# Patient Record
Sex: Female | Born: 1970 | Race: Black or African American | Hispanic: No | Marital: Married | Smoking: Never smoker
Health system: Southern US, Community
[De-identification: ages and names within clinical notes are randomized; demographics above are authoritative.]

## PROBLEM LIST (undated history)

## (undated) DIAGNOSIS — A499 Bacterial infection, unspecified: Secondary | ICD-10-CM

## (undated) DIAGNOSIS — IMO0002 Reserved for concepts with insufficient information to code with codable children: Secondary | ICD-10-CM

## (undated) DIAGNOSIS — B379 Candidiasis, unspecified: Secondary | ICD-10-CM

## (undated) DIAGNOSIS — E611 Iron deficiency: Secondary | ICD-10-CM

## (undated) DIAGNOSIS — R1030 Lower abdominal pain, unspecified: Secondary | ICD-10-CM

## (undated) DIAGNOSIS — Z8619 Personal history of other infectious and parasitic diseases: Secondary | ICD-10-CM

## (undated) DIAGNOSIS — B009 Herpesviral infection, unspecified: Secondary | ICD-10-CM

## (undated) HISTORY — PX: WISDOM TOOTH EXTRACTION: SHX21

## (undated) HISTORY — DX: Iron deficiency: E61.1

## (undated) HISTORY — DX: Bacterial infection, unspecified: A49.9

## (undated) HISTORY — DX: Lower abdominal pain, unspecified: R10.30

## (undated) HISTORY — PX: ABDOMINAL HYSTERECTOMY: SHX81

## (undated) HISTORY — DX: Personal history of other infectious and parasitic diseases: Z86.19

## (undated) HISTORY — DX: Candidiasis, unspecified: B37.9

## (undated) HISTORY — DX: Herpesviral infection, unspecified: B00.9

## (undated) HISTORY — DX: Reserved for concepts with insufficient information to code with codable children: IMO0002

---

## 2011-08-30 ENCOUNTER — Encounter: Payer: Self-pay | Admitting: Internal Medicine

## 2011-09-19 ENCOUNTER — Ambulatory Visit: Payer: Self-pay | Admitting: Internal Medicine

## 2011-10-12 ENCOUNTER — Encounter: Payer: Self-pay | Admitting: Internal Medicine

## 2011-10-17 ENCOUNTER — Ambulatory Visit: Payer: Self-pay | Admitting: Internal Medicine

## 2011-11-20 DIAGNOSIS — IMO0002 Reserved for concepts with insufficient information to code with codable children: Secondary | ICD-10-CM

## 2011-11-20 DIAGNOSIS — R87619 Unspecified abnormal cytological findings in specimens from cervix uteri: Secondary | ICD-10-CM

## 2011-11-20 HISTORY — DX: Unspecified abnormal cytological findings in specimens from cervix uteri: R87.619

## 2011-11-20 HISTORY — DX: Reserved for concepts with insufficient information to code with codable children: IMO0002

## 2011-12-25 DIAGNOSIS — R1030 Lower abdominal pain, unspecified: Secondary | ICD-10-CM

## 2011-12-25 HISTORY — DX: Lower abdominal pain, unspecified: R10.30

## 2011-12-26 ENCOUNTER — Encounter: Payer: Self-pay | Admitting: Obstetrics and Gynecology

## 2011-12-26 ENCOUNTER — Ambulatory Visit (INDEPENDENT_AMBULATORY_CARE_PROVIDER_SITE_OTHER): Payer: Commercial Indemnity | Admitting: Obstetrics and Gynecology

## 2011-12-26 VITALS — BP 110/74 | Wt 132.0 lb

## 2011-12-26 DIAGNOSIS — Z0189 Encounter for other specified special examinations: Secondary | ICD-10-CM

## 2011-12-26 DIAGNOSIS — A6000 Herpesviral infection of urogenital system, unspecified: Secondary | ICD-10-CM

## 2011-12-26 DIAGNOSIS — A6009 Herpesviral infection of other urogenital tract: Secondary | ICD-10-CM

## 2011-12-26 DIAGNOSIS — R87610 Atypical squamous cells of undetermined significance on cytologic smear of cervix (ASC-US): Secondary | ICD-10-CM

## 2011-12-26 DIAGNOSIS — B977 Papillomavirus as the cause of diseases classified elsewhere: Secondary | ICD-10-CM

## 2011-12-26 NOTE — Progress Notes (Signed)
Previous Pap Smear: 12/04/2011 acus hpv positive  Previous Colposcopy: none  Referred From: DR. Adonis Brook  LMP:12/04/2011 Contraception: b/c pills  G,P: 3,1  HISTORY OF PRESENT ILLNESS  [name] is a 41 y.o. year old female,G3P1021, who presents for a problem visit. The patient has a history of an ASCUS Pap smear with high risk HPV.  She has a history of herpes virus.  She currently uses her control pills.  This is her first visit to our office.  Subjective:  Occasional vague abdominal pain.  Objective:  BP 110/74  Wt 132 lb (59.875 kg)  LMP 12/04/2011   General: alert and no distress GI: soft and nontender  External genitalia: normal general appearance Vaginal: normal without tenderness, induration or masses and relaxation noted Cervix: see colposcopy note Adnexa: normal bimanual exam Uterus: normal size shape and consistency  COLPOSCOPY NOTE:  The colposcopy procedure was explained.  The patient's questions were answered. A permit is signed .Urine pregnancy test is negative. A speculum exam was performed.  The cervix was prepped with acetic acid and Hurricaine gel.  The cervix was evaluated using a white light and the green filter. Findings: white epithelium was noted at the 1:00 and 9:00 positions. .  The endocervical canal was clear.  No lesions were seen.  Biopsies obtained: 1:00 and 9:00.  Hemostasis was adequate.  An endocervical curettage was performed.  Again, hemostasis was adequate. The procedure was terminated.  The patient tolerated her procedure well.  The specimens were sent to pathology.  Assessment:  ASCUS Pap with HPV Herpes infection Date abdominal pain  Plan:  Biopsy at 1:00, 9:00, and endocervical curettage sent to pathology.  Review of abdominal pain next visit.  Abnormal Pap smears, HPV, and Gardasil were discussed  Return to office in 2 week(s).  Leonard Schwartz M.D.  12/26/2011 6:54 PM

## 2011-12-27 ENCOUNTER — Encounter: Payer: Self-pay | Admitting: Obstetrics and Gynecology

## 2011-12-28 LAB — PATHOLOGY

## 2012-01-11 ENCOUNTER — Encounter: Payer: Self-pay | Admitting: Obstetrics and Gynecology

## 2012-01-11 ENCOUNTER — Ambulatory Visit (INDEPENDENT_AMBULATORY_CARE_PROVIDER_SITE_OTHER): Payer: Commercial Indemnity | Admitting: Obstetrics and Gynecology

## 2012-01-11 VITALS — BP 118/84 | Wt 132.0 lb

## 2012-01-11 DIAGNOSIS — N912 Amenorrhea, unspecified: Secondary | ICD-10-CM

## 2012-01-11 DIAGNOSIS — N870 Mild cervical dysplasia: Secondary | ICD-10-CM

## 2012-01-11 DIAGNOSIS — IMO0002 Reserved for concepts with insufficient information to code with codable children: Secondary | ICD-10-CM

## 2012-01-11 LAB — POCT URINE PREGNANCY: Preg Test, Ur: NEGATIVE

## 2012-01-11 NOTE — Progress Notes (Signed)
HISTORY OF PRESENT ILLNESS  [name] is a 41 y.o. year old female,G3P1021, who presents for a problem visit. The patient had colposcopy and biopsies which showed CIN-1.  See path report below.  Subjective:  The patient reports not having a period.  She takes are birth-control pills every day.  Objective:  BP 118/84  Wt 132 lb (59.875 kg)  LMP 12/04/2011   General: no distress GI: soft and nontender  Exam deferred.  Pregnancy test: Negative  Assessment:  CIN-1  Amenorrhea on birth control pills  Plan:  CIN-1 discussed. Management options reviewed.  Risk and benefits outlined.  I recommend repeating Pap smear in 6 months.  Menorrhea on birth control pills reviewed.  We will observe only for now.  Continue daily birth control pills.  Return to office prn if symptoms worsen or fail to improve.   Leonard Schwartz M.D.  01/11/2012 6:07 PM    FINAL DIAGNOSIS: A. Cervix- Biopsy, 1 o'clock:  Low grade squamous intraepithelial lesion (LSIL), mild dysplasia, CIN I.  See comment.  B. Cervix- Biopsy, 9 o'clock:  Low grade squamous intraepithelial lesion (LSIL), mild dysplasia, CIN I.  See comment.  C. Endocervix - Curettage:  Nondiagnostic.  No significant tissue survived processing.

## 2012-07-01 ENCOUNTER — Ambulatory Visit: Payer: Commercial Indemnity | Admitting: Obstetrics and Gynecology

## 2012-07-01 ENCOUNTER — Encounter: Payer: Self-pay | Admitting: Obstetrics and Gynecology

## 2012-07-01 ENCOUNTER — Other Ambulatory Visit: Payer: Self-pay

## 2012-07-01 VITALS — BP 90/60 | HR 68 | Resp 16 | Ht 64.0 in | Wt 125.0 lb

## 2012-07-01 DIAGNOSIS — Z01419 Encounter for gynecological examination (general) (routine) without abnormal findings: Secondary | ICD-10-CM

## 2012-07-01 DIAGNOSIS — R102 Pelvic and perineal pain: Secondary | ICD-10-CM

## 2012-07-01 DIAGNOSIS — Z124 Encounter for screening for malignant neoplasm of cervix: Secondary | ICD-10-CM

## 2012-07-01 MED ORDER — VALACYCLOVIR HCL 500 MG PO TABS: 500.0000 mg | ORAL_TABLET | Freq: Every day | ORAL | Status: AC

## 2012-07-01 NOTE — Progress Notes (Signed)
ANNUAL GYNECOLOGIC EXAMINATION   Loretta Sweeney is a 42 y.o. female, [id number], who presents for an annual exam. The patient has a history of CIN-1.  She has a history of herpes but has infrequent outbreaks.  She complains of occasional pain deep in her pelvis but also increasing dyspareunia. The patient's mother had breast cancer in her 28s.  Prior Hysterectomy: No    History   Social History  . Marital Status: Married    Spouse Name: N/A    Number of Children: N/A  . Years of Education: N/A   Social History Main Topics  . Smoking status: Never Smoker   . Smokeless tobacco: Never Used  . Alcohol Use: No  . Drug Use: No  . Sexually Active: Yes    Birth Control/ Protection: Pill   Other Topics Concern  . None   Social History Narrative  . None    Menstrual cycle:   LMP: Patient's last menstrual period was 06/24/2012.             The following portions of the patient's history were reviewed and updated as appropriate: allergies, current medications, past family history, past medical history, past social history, past surgical history and problem list.  Review of Systems Pertinent items are noted in HPI. Breast:Negative for breast lump,nipple discharge or nipple retraction Gastrointestinal: Negative for abdominal pain, change in bowel habits or rectal bleeding Urinary:negative   Objective:    BP 90/60  Pulse 68  Resp 16  Ht 5\' 4"  (1.626 m)  Wt 125 lb (56.7 kg)  BMI 21.45 kg/m2  LMP 06/24/2012    Weight:  Wt Readings from Last 1 Encounters:  07/01/12 125 lb (56.7 kg)          BMI: Body mass index is 21.45 kg/(m^2).  General Appearance: Alert, appropriate appearance for age. No acute distress HEENT: Grossly normal Neck / Thyroid: Supple, no masses, nodes or enlargement Lungs: clear to auscultation bilaterally Back: No CVA tenderness Breast Exam: No masses or nodes.No dimpling, nipple retraction or discharge. Cardiovascular: Regular rate and rhythm. S1, S2, no  murmur Gastrointestinal: Soft, non-tender, no masses or organomegaly  ++++++++++++++++++++++++++++++++++++++++++++++++++++++++  Pelvic Exam: External genitalia: normal general appearance Vaginal: normal without tenderness, induration or masses. Relaxation: Yes Cervix: normal appearance Adnexa: normal bimanual exam Uterus: normal size, shape, and consistency Rectovaginal: normal rectal, no masses  ++++++++++++++++++++++++++++++++++++++++++++++++++++++++  Lymphatic Exam: Non-palpable nodes in neck, clavicular, axillary, or inguinal regions Neurologic: Normal speech, no tremor  Psychiatric: Alert and oriented, appropriate affect.  Assessment:    Normal gyn exam   Overweight or obese: No   Pelvic relaxation: Yes  Pelvic pain  Dyspareunia  Herpes virus  Mother with breast cancer at age 5   Plan:    mammogram pap smear return in two weeks Contraception:bilateral tubal ligation    Medications prescribed: Valtrex 100 mg daily  STD screen request: Yes ; gonorrhea and Chlamydia sent.  GYN ultrasound  The updated Pap smear screening guidelines were discussed with the patient. The patient requested that I obtain a Pap smear: Yes.  Kegel exercises discussed: Yes.  Proper diet and regular exercise were reviewed.  Annual mammograms recommended starting at age 23. Proper breast care was discussed.  Screening colonoscopy is recommended beginning at age 5.  Regular health maintenance was reviewed.  Sleep hygiene was discussed.  Adequate calcium and vitamin D intake was emphasized.  Leonard Schwartz M.D.    Regular Periods: yes Mammogram: no  Monthly Breast Ex.: no Exercise: yes  Tetanus < 10 years: no Seatbelts: yes  NI. Bladder Functn.: yes Abuse at home: no  Daily BM's: yes Stressful Work: no  Healthy Diet: no Sigmoid-Colonoscopy: None  Calcium: no Medical problems this year: None   LAST PAP:12/2011  Contraception: BC Pill  Mammogram:   None  PCP: Windell Moment  PMH: None  FMH: None  Last Bone Scan: None

## 2012-07-05 LAB — PAP IG, CT-NG, RFX HPV ASCU

## 2012-07-06 LAB — HUMAN PAPILLOMAVIRUS, HIGH RISK: HPV DNA High Risk: NOT DETECTED

## 2012-07-06 NOTE — Progress Notes (Signed)
Quick Note:  Send atypical Pap smear letter with a note to return for repeat Pap in 12 months. ______ 

## 2012-07-08 ENCOUNTER — Encounter: Payer: Self-pay | Admitting: Obstetrics and Gynecology

## 2012-07-15 ENCOUNTER — Ambulatory Visit: Payer: Commercial Indemnity | Admitting: Obstetrics and Gynecology

## 2012-07-15 ENCOUNTER — Other Ambulatory Visit: Payer: Self-pay | Admitting: Obstetrics and Gynecology

## 2012-07-15 ENCOUNTER — Ambulatory Visit: Payer: Commercial Indemnity

## 2012-07-15 ENCOUNTER — Encounter: Payer: Self-pay | Admitting: Obstetrics and Gynecology

## 2012-07-15 VITALS — BP 112/76 | HR 72 | Wt 128.0 lb

## 2012-07-15 DIAGNOSIS — IMO0002 Reserved for concepts with insufficient information to code with codable children: Secondary | ICD-10-CM | POA: Insufficient documentation

## 2012-07-15 MED ORDER — DOXYCYCLINE HYCLATE 50 MG PO CAPS: 100.0000 mg | ORAL_CAPSULE | Freq: Two times a day (BID) | ORAL | Status: DC

## 2012-07-15 NOTE — Progress Notes (Signed)
Subjective:    Loretta Sweeney is a 42 y.o. female, [id number], who presents for Gyn ultrasound because of pelvic pain . She also complains of dyspareunia. Gonorrhea and Chlamydia were negative.  Her Pap smear showed atypia without HPV.  The following portions of the patient's history were reviewed and updated as appropriate: allergies, current medications, past family history.  Objective:    BP 112/76  Pulse 72  Wt 128 lb (58.06 kg)  BMI 21.96 kg/m2  LMP 06/24/2012  Breastfeeding? No    Weight:  Wt Readings from Last 1 Encounters:  07/15/12 128 lb (58.06 kg)          BMI: Body mass index is 21.96 kg/(m^2).  ULTRASOUND: Uterus 7.57 x 5.11 cm    Adnexa no masses    Endometrium 5.10 mm    Free fluid: no     Assessment:    dyspareunia    Plan:    Doxycycline 100 mg twice each day for 10 days.  Ibuprofen 800 mg every 8 hours for 3 days  Call of improvement.  Laparoscopy discussed as an option.   Dr. Stefano Gaul

## 2014-03-23 ENCOUNTER — Encounter: Payer: Self-pay | Admitting: Obstetrics and Gynecology

## 2015-01-28 ENCOUNTER — Other Ambulatory Visit: Payer: Self-pay | Admitting: Nurse Practitioner

## 2015-01-28 DIAGNOSIS — R1011 Right upper quadrant pain: Secondary | ICD-10-CM

## 2015-01-28 DIAGNOSIS — IMO0001 Reserved for inherently not codable concepts without codable children: Secondary | ICD-10-CM

## 2015-01-28 DIAGNOSIS — R140 Abdominal distension (gaseous): Secondary | ICD-10-CM

## 2015-02-04 ENCOUNTER — Ambulatory Visit
Admission: RE | Admit: 2015-02-04 | Discharge: 2015-02-04 | Disposition: A | Discharge status: Home / Self Care | Payer: Commercial Indemnity | Source: Ambulatory Visit | Attending: Nurse Practitioner | Admitting: Nurse Practitioner

## 2015-11-04 ENCOUNTER — Other Ambulatory Visit: Payer: Self-pay | Admitting: Obstetrics and Gynecology

## 2015-11-22 NOTE — Patient Instructions (Addendum)
Your procedure is scheduled on:  Thursday, July 13  Enter through the [employer] [facility] at: [place] up the phone at the desk and dial [phone].  Call this number if you have problems the morning of surgery: [phone].  Remember: Do NOT eat food or drink after Midnight Wednesday, 7/12 Take these medicines the morning of surgery with a SIP OF WATER: None  Do NOT wear jewelry (body piercing), metal hair clips/bobby pins, make-up, or nail polish. Do NOT wear lotions, powders, or perfumes.  You may wear deoderant. Do NOT shave for 48 hours prior to surgery. Do NOT bring valuables to the hospital.  Leave suitcase in car.  After surgery it may be brought to your room.  For patients admitted to the hospital, checkout time is 11:00 AM the day of discharge. Home with husband [place] cell [phone].

## 2015-11-24 ENCOUNTER — Encounter (HOSPITAL_COMMUNITY): Payer: Self-pay

## 2015-11-24 ENCOUNTER — Encounter (HOSPITAL_COMMUNITY)
Admission: RE | Admit: 2015-11-24 | Discharge: 2015-11-24 | Disposition: A | Discharge status: Home / Self Care | Payer: Managed Care, Other (non HMO) | Source: Ambulatory Visit | Attending: Obstetrics and Gynecology | Admitting: Obstetrics and Gynecology

## 2015-11-24 DIAGNOSIS — Z01812 Encounter for preprocedural laboratory examination: Secondary | ICD-10-CM | POA: Insufficient documentation

## 2015-11-24 DIAGNOSIS — D259 Leiomyoma of uterus, unspecified: Secondary | ICD-10-CM | POA: Diagnosis not present

## 2015-11-24 DIAGNOSIS — Z803 Family history of malignant neoplasm of breast: Secondary | ICD-10-CM | POA: Insufficient documentation

## 2015-11-24 DIAGNOSIS — Z8249 Family history of ischemic heart disease and other diseases of the circulatory system: Secondary | ICD-10-CM | POA: Diagnosis not present

## 2015-11-24 DIAGNOSIS — N946 Dysmenorrhea, unspecified: Secondary | ICD-10-CM | POA: Diagnosis not present

## 2015-11-24 DIAGNOSIS — N800 Endometriosis of uterus: Secondary | ICD-10-CM | POA: Diagnosis not present

## 2015-11-24 DIAGNOSIS — Z833 Family history of diabetes mellitus: Secondary | ICD-10-CM | POA: Insufficient documentation

## 2015-11-24 DIAGNOSIS — R87610 Atypical squamous cells of undetermined significance on cytologic smear of cervix (ASC-US): Secondary | ICD-10-CM | POA: Insufficient documentation

## 2015-11-24 LAB — CBC
HCT: 38.1 % (ref 36.0–46.0)
Hemoglobin: 12.9 g/dL (ref 12.0–15.0)
MCH: 30.3 pg (ref 26.0–34.0)
MCHC: 33.9 g/dL (ref 30.0–36.0)
MCV: 89.4 fL (ref 78.0–100.0)
PLATELETS: 324 10*3/uL (ref 150–400)
RBC: 4.26 MIL/uL (ref 3.87–5.11)
RDW: 13 % (ref 11.5–15.5)
WBC: 6.1 10*3/uL (ref 4.0–10.5)

## 2015-12-02 ENCOUNTER — Ambulatory Visit (HOSPITAL_COMMUNITY): Payer: Managed Care, Other (non HMO) | Admitting: Anesthesiology

## 2015-12-02 ENCOUNTER — Encounter (HOSPITAL_COMMUNITY)
Admission: AD | Disposition: A | Discharge status: Home / Self Care | Payer: Self-pay | Source: Ambulatory Visit | Attending: Obstetrics and Gynecology

## 2015-12-02 ENCOUNTER — Ambulatory Visit (HOSPITAL_COMMUNITY)
Admission: AD | Admit: 2015-12-02 | Discharge: 2015-12-03 | Disposition: A | Discharge status: Home / Self Care | Payer: Managed Care, Other (non HMO) | Source: Ambulatory Visit | Attending: Obstetrics and Gynecology | Admitting: Obstetrics and Gynecology

## 2015-12-02 ENCOUNTER — Encounter (HOSPITAL_COMMUNITY): Payer: Self-pay

## 2015-12-02 DIAGNOSIS — D251 Intramural leiomyoma of uterus: Secondary | ICD-10-CM | POA: Insufficient documentation

## 2015-12-02 DIAGNOSIS — A6000 Herpesviral infection of urogenital system, unspecified: Secondary | ICD-10-CM | POA: Diagnosis not present

## 2015-12-02 DIAGNOSIS — N800 Endometriosis of uterus: Secondary | ICD-10-CM | POA: Insufficient documentation

## 2015-12-02 DIAGNOSIS — D252 Subserosal leiomyoma of uterus: Secondary | ICD-10-CM | POA: Diagnosis not present

## 2015-12-02 DIAGNOSIS — R87610 Atypical squamous cells of undetermined significance on cytologic smear of cervix (ASC-US): Secondary | ICD-10-CM | POA: Insufficient documentation

## 2015-12-02 DIAGNOSIS — N838 Other noninflammatory disorders of ovary, fallopian tube and broad ligament: Secondary | ICD-10-CM | POA: Diagnosis not present

## 2015-12-02 DIAGNOSIS — N946 Dysmenorrhea, unspecified: Principal | ICD-10-CM | POA: Diagnosis present

## 2015-12-02 HISTORY — PX: VAGINAL HYSTERECTOMY: SHX2639

## 2015-12-02 LAB — CBC
HCT: 35.6 % — ABNORMAL LOW (ref 36.0–46.0)
HEMOGLOBIN: 11.9 g/dL — AB (ref 12.0–15.0)
MCH: 29.3 pg (ref 26.0–34.0)
MCHC: 33.4 g/dL (ref 30.0–36.0)
MCV: 87.7 fL (ref 78.0–100.0)
Platelets: 310 10*3/uL (ref 150–400)
RBC: 4.06 MIL/uL (ref 3.87–5.11)
RDW: 13.3 % (ref 11.5–15.5)
WBC: 13.2 10*3/uL — AB (ref 4.0–10.5)

## 2015-12-02 LAB — PREGNANCY, URINE: PREG TEST UR: NEGATIVE

## 2015-12-02 LAB — CREATININE, SERUM: Creatinine, Ser: 0.68 mg/dL (ref 0.44–1.00)

## 2015-12-02 SURGERY — HYSTERECTOMY, VAGINAL
Anesthesia: General | Site: Vagina

## 2015-12-02 MED ORDER — ONDANSETRON HCL 4 MG/2ML IJ SOLN
INTRAMUSCULAR | Status: DC | PRN
  Administered 2015-12-02: 4 mg via INTRAVENOUS

## 2015-12-02 MED ORDER — LIDOCAINE HCL (CARDIAC) 20 MG/ML IV SOLN
INTRAVENOUS | Status: DC | PRN
  Administered 2015-12-02: 50 mg via INTRAVENOUS

## 2015-12-02 MED ORDER — KETOROLAC TROMETHAMINE 30 MG/ML IJ SOLN: 30.0000 mg | Freq: Four times a day (QID) | INTRAMUSCULAR | Status: DC

## 2015-12-02 MED ORDER — BUPIVACAINE-EPINEPHRINE (PF) 0.5% -1:200000 IJ SOLN
INTRAMUSCULAR | Status: AC
  Filled 2015-12-02: qty 30

## 2015-12-02 MED ORDER — LIDOCAINE HCL (CARDIAC) 20 MG/ML IV SOLN
INTRAVENOUS | Status: AC
  Filled 2015-12-02: qty 5

## 2015-12-02 MED ORDER — DEXAMETHASONE SODIUM PHOSPHATE 10 MG/ML IJ SOLN
INTRAMUSCULAR | Status: DC | PRN
  Administered 2015-12-02: 8 mg via INTRAVENOUS

## 2015-12-02 MED ORDER — BUPIVACAINE LIPOSOME 1.3 % IJ SUSP
20.0000 mL | Freq: Once | INTRAMUSCULAR | Status: AC
  Administered 2015-12-02: 20 mL
  Filled 2015-12-02: qty 20

## 2015-12-02 MED ORDER — SCOPOLAMINE 1 MG/3DAYS TD PT72
1.0000 | MEDICATED_PATCH | Freq: Once | TRANSDERMAL | Status: DC
  Administered 2015-12-02: 1.5 mg via TRANSDERMAL

## 2015-12-02 MED ORDER — ROCURONIUM BROMIDE 100 MG/10ML IV SOLN
INTRAVENOUS | Status: DC | PRN
  Administered 2015-12-02: 5 mg via INTRAVENOUS
  Administered 2015-12-02: 50 mg via INTRAVENOUS

## 2015-12-02 MED ORDER — CEFAZOLIN SODIUM-DEXTROSE 2-4 GM/100ML-% IV SOLN
2.0000 g | INTRAVENOUS | Status: AC
  Administered 2015-12-02: 2 g via INTRAVENOUS

## 2015-12-02 MED ORDER — PROPOFOL 10 MG/ML IV BOLUS
INTRAVENOUS | Status: DC | PRN
  Administered 2015-12-02: 200 mg via INTRAVENOUS

## 2015-12-02 MED ORDER — HYDROMORPHONE HCL 1 MG/ML IJ SOLN: 0.2500 mg | INTRAMUSCULAR | Status: DC | PRN

## 2015-12-02 MED ORDER — MIDAZOLAM HCL 2 MG/2ML IJ SOLN
INTRAMUSCULAR | Status: DC | PRN
  Administered 2015-12-02: 2 mg via INTRAVENOUS

## 2015-12-02 MED ORDER — BUPIVACAINE-EPINEPHRINE 0.5% -1:200000 IJ SOLN
INTRAMUSCULAR | Status: DC | PRN
  Administered 2015-12-02: 10 mL

## 2015-12-02 MED ORDER — KETOROLAC TROMETHAMINE 30 MG/ML IJ SOLN
30.0000 mg | Freq: Four times a day (QID) | INTRAMUSCULAR | Status: DC
  Administered 2015-12-02 – 2015-12-03 (×3): 30 mg via INTRAVENOUS
  Filled 2015-12-02 (×3): qty 1

## 2015-12-02 MED ORDER — PROMETHAZINE HCL 25 MG/ML IJ SOLN: 6.2500 mg | INTRAMUSCULAR | Status: DC | PRN

## 2015-12-02 MED ORDER — LIDOCAINE-EPINEPHRINE (PF) 2 %-1:200000 IJ SOLN
INTRAMUSCULAR | Status: AC
  Filled 2015-12-02: qty 20

## 2015-12-02 MED ORDER — SUGAMMADEX SODIUM 200 MG/2ML IV SOLN
INTRAVENOUS | Status: DC | PRN
  Administered 2015-12-02: 128 mg via INTRAVENOUS

## 2015-12-02 MED ORDER — MIDAZOLAM HCL 2 MG/2ML IJ SOLN
INTRAMUSCULAR | Status: AC
  Filled 2015-12-02: qty 2

## 2015-12-02 MED ORDER — ONDANSETRON HCL 4 MG/2ML IJ SOLN
INTRAMUSCULAR | Status: AC
  Filled 2015-12-02: qty 2

## 2015-12-02 MED ORDER — ONDANSETRON HCL 4 MG/2ML IJ SOLN: 4.0000 mg | Freq: Four times a day (QID) | INTRAMUSCULAR | Status: DC | PRN

## 2015-12-02 MED ORDER — SODIUM CHLORIDE 0.9 % IJ SOLN
INTRAMUSCULAR | Status: AC
  Filled 2015-12-02: qty 10

## 2015-12-02 MED ORDER — LACTATED RINGERS IV SOLN
INTRAVENOUS | Status: DC
  Administered 2015-12-02: 125 mL/h via INTRAVENOUS
  Administered 2015-12-02: 1000 mL via INTRAVENOUS

## 2015-12-02 MED ORDER — FENTANYL CITRATE (PF) 250 MCG/5ML IJ SOLN
INTRAMUSCULAR | Status: AC
  Filled 2015-12-02: qty 5

## 2015-12-02 MED ORDER — GLYCOPYRROLATE 0.2 MG/ML IJ SOLN
INTRAMUSCULAR | Status: AC
  Filled 2015-12-02: qty 1

## 2015-12-02 MED ORDER — IBUPROFEN 800 MG PO TABS: 800.0000 mg | ORAL_TABLET | Freq: Three times a day (TID) | ORAL | Status: DC | PRN

## 2015-12-02 MED ORDER — DEXTROSE IN LACTATED RINGERS 5 % IV SOLN
INTRAVENOUS | Status: DC
  Administered 2015-12-02 – 2015-12-03 (×3): via INTRAVENOUS

## 2015-12-02 MED ORDER — KETOROLAC TROMETHAMINE 30 MG/ML IJ SOLN
INTRAMUSCULAR | Status: DC | PRN
  Administered 2015-12-02: 30 mg via INTRAVENOUS

## 2015-12-02 MED ORDER — SCOPOLAMINE 1 MG/3DAYS TD PT72
MEDICATED_PATCH | TRANSDERMAL | Status: AC
  Administered 2015-12-02: 1.5 mg via TRANSDERMAL
  Filled 2015-12-02: qty 1

## 2015-12-02 MED ORDER — FENTANYL CITRATE (PF) 100 MCG/2ML IJ SOLN
INTRAMUSCULAR | Status: AC
  Filled 2015-12-02: qty 2

## 2015-12-02 MED ORDER — FENTANYL CITRATE (PF) 100 MCG/2ML IJ SOLN
INTRAMUSCULAR | Status: DC | PRN
  Administered 2015-12-02 (×2): 50 ug via INTRAVENOUS
  Administered 2015-12-02: 100 ug via INTRAVENOUS

## 2015-12-02 MED ORDER — HYDROMORPHONE HCL 2 MG PO TABS
2.0000 mg | ORAL_TABLET | ORAL | Status: DC | PRN
  Administered 2015-12-02 – 2015-12-03 (×2): 2 mg via ORAL
  Filled 2015-12-02 (×2): qty 1

## 2015-12-02 MED ORDER — SODIUM CHLORIDE 0.9 % IJ SOLN
INTRAMUSCULAR | Status: DC | PRN
  Administered 2015-12-02: 10 mL via INTRAVENOUS

## 2015-12-02 MED ORDER — SIMETHICONE 80 MG PO CHEW: 80.0000 mg | CHEWABLE_TABLET | Freq: Four times a day (QID) | ORAL | Status: DC | PRN

## 2015-12-02 MED ORDER — DEXAMETHASONE SODIUM PHOSPHATE 10 MG/ML IJ SOLN
INTRAMUSCULAR | Status: AC
  Filled 2015-12-02: qty 1

## 2015-12-02 MED ORDER — ENOXAPARIN SODIUM 40 MG/0.4ML ~~LOC~~ SOLN
40.0000 mg | SUBCUTANEOUS | Status: DC
  Administered 2015-12-03: 40 mg via SUBCUTANEOUS
  Filled 2015-12-02 (×2): qty 0.4

## 2015-12-02 MED ORDER — ROCURONIUM BROMIDE 100 MG/10ML IV SOLN
INTRAVENOUS | Status: AC
  Filled 2015-12-02: qty 1

## 2015-12-02 MED ORDER — ONDANSETRON HCL 4 MG PO TABS: 4.0000 mg | ORAL_TABLET | Freq: Four times a day (QID) | ORAL | Status: DC | PRN

## 2015-12-02 MED ORDER — PROPOFOL 10 MG/ML IV BOLUS
INTRAVENOUS | Status: AC
  Filled 2015-12-02: qty 20

## 2015-12-02 SURGICAL SUPPLY — 29 items
CANISTER SUCT 3000ML (MISCELLANEOUS) ×3 IMPLANT
CLOTH BEACON ORANGE TIMEOUT ST (SAFETY) ×3 IMPLANT
CONT PATH 16OZ SNAP LID 3702 (MISCELLANEOUS) IMPLANT
DECANTER SPIKE VIAL GLASS SM (MISCELLANEOUS) IMPLANT
DRAPE SHEET LG 3/4 BI-LAMINATE (DRAPES) ×6 IMPLANT
GLOVE BIOGEL PI IND STRL 6.5 (GLOVE) ×1 IMPLANT
GLOVE BIOGEL PI IND STRL 7.0 (GLOVE) ×1 IMPLANT
GLOVE BIOGEL PI IND STRL 8.5 (GLOVE) ×1 IMPLANT
GLOVE BIOGEL PI INDICATOR 6.5 (GLOVE) ×2
GLOVE BIOGEL PI INDICATOR 7.0 (GLOVE) ×2
GLOVE BIOGEL PI INDICATOR 8.5 (GLOVE) ×2
GLOVE ECLIPSE 8.0 STRL XLNG CF (GLOVE) ×6 IMPLANT
GOWN STRL REUS W/TWL LRG LVL3 (GOWN DISPOSABLE) ×12 IMPLANT
NEEDLE HYPO 22GX1.5 SAFETY (NEEDLE) IMPLANT
NEEDLE MAYO CATGUT SZ4 (NEEDLE) ×3 IMPLANT
NEEDLE SPNL 22GX3.5 QUINCKE BK (NEEDLE) IMPLANT
NS IRRIG 1000ML POUR BTL (IV SOLUTION) ×3 IMPLANT
PACK VAGINAL WOMENS (CUSTOM PROCEDURE TRAY) ×3 IMPLANT
PAD OB MATERNITY 4.3X12.25 (PERSONAL CARE ITEMS) ×3 IMPLANT
SUT CHROMIC 2 0 TIES 18 (SUTURE) IMPLANT
SUT VIC AB 0 CT1 18XCR BRD8 (SUTURE) ×4 IMPLANT
SUT VIC AB 0 CT1 27 (SUTURE) ×2
SUT VIC AB 0 CT1 27XBRD ANBCTR (SUTURE) ×1 IMPLANT
SUT VIC AB 0 CT1 8-18 (SUTURE) ×8
SUT VICRYL 0 TIES 12 18 (SUTURE) ×3 IMPLANT
SYR TB 1ML 25GX5/8 (SYRINGE) IMPLANT
TOWEL OR 17X24 6PK STRL BLUE (TOWEL DISPOSABLE) ×6 IMPLANT
TRAY FOLEY CATH SILVER 14FR (SET/KITS/TRAYS/PACK) ×3 IMPLANT
WATER STERILE IRR 1000ML POUR (IV SOLUTION) ×3 IMPLANT

## 2015-12-02 NOTE — Progress Notes (Signed)
Subjective: Patient reports tolerating PO.    Objective: I have reviewed patient's vital signs and intake and output.  General: alert and no distress Resp: clear to auscultation bilaterally Cardio: regular rate and rhythm, S1, S2 normal, no murmur, click, rub or gallop GI: soft, non-tender; bowel sounds normal; no masses,  no organomegaly Extremities: extremities normal, atraumatic, no cyanosis or edema Vaginal Bleeding: minimal   Assessment/Plan:  Doing well status post vaginal hysterectomy and bilateral salpingectomy  Advance diet. Remove Foley catheter in the morning. Plan for discharge in the morning.   LOS: 0 days    Delories Mauri V 12/02/2015, 8:16 PM

## 2015-12-02 NOTE — Progress Notes (Signed)
The patient was interviewed and examined today.  The previously documented history and physical examination was reviewed. There are no changes. The operative procedure was reviewed. The risks and benefits were outlined again. The specific risks include, but are not limited to, anesthetic complications, bleeding, infections, and possible damage to the surrounding organs. The patient's questions were answered.  We are ready to proceed as outlined. The likelihood of the patient achieving the goals of this procedure is very likely.   BP 127/91 mmHg  Pulse 85  Temp(Src) 98.4 F (36.9 C) (Oral)  Resp 20  SpO2 100%  CBC    Component Value Date/Time   WBC 6.1 11/24/2015 0845   RBC 4.26 11/24/2015 0845   HGB 12.9 11/24/2015 0845   HCT 38.1 11/24/2015 0845   PLT 324 11/24/2015 0845   MCV 89.4 11/24/2015 0845   MCH 30.3 11/24/2015 0845   MCHC 33.9 11/24/2015 0845   RDW 13.0 11/24/2015 0845   Gildardo Cranker, M.D.

## 2015-12-02 NOTE — Addendum Note (Signed)
Addendum  created 12/02/15 1559 by Raenette Rover, CRNA   Modules edited: Clinical Notes   Clinical Notes:  File: [id number]

## 2015-12-02 NOTE — Anesthesia Preprocedure Evaluation (Signed)
Anesthesia Evaluation  Patient identified by MRN, date of birth, ID band Patient awake    Reviewed: Allergy & Precautions, NPO status , Patient's Chart, lab work & pertinent test results  Airway Mallampati: II  TM Distance: >3 FB Neck ROM: Full    Dental no notable dental hx. (+) Dental Advisory Given   Pulmonary neg pulmonary ROS,    Pulmonary exam normal        Cardiovascular negative cardio ROS Normal cardiovascular exam     Neuro/Psych negative neurological ROS  negative psych ROS   GI/Hepatic negative GI ROS, Neg liver ROS,   Endo/Other  negative endocrine ROS  Renal/GU negative Renal ROS  negative genitourinary   Musculoskeletal negative musculoskeletal ROS (+)   Abdominal   Peds negative pediatric ROS (+)  Hematology negative hematology ROS (+)   Anesthesia Other Findings   Reproductive/Obstetrics negative OB ROS                             Anesthesia Physical Anesthesia Plan  ASA: I  Anesthesia Plan: General   Post-op Pain Management:    Induction: Intravenous  Airway Management Planned: Oral ETT  Additional Equipment:   Intra-op Plan:   Post-operative Plan: Extubation in OR  Informed Consent: I have reviewed the patients History and Physical, chart, labs and discussed the procedure including the risks, benefits and alternatives for the proposed anesthesia with the patient or authorized representative who has indicated his/her understanding and acceptance.   Dental advisory given  Plan Discussed with: CRNA, Anesthesiologist and Surgeon  Anesthesia Plan Comments:         Anesthesia Quick Evaluation

## 2015-12-02 NOTE — H&P (Signed)
Admission History and Physical Exam for a Gynecology Patient  [name] is a 45 y.o. female, [id number], who presents for a vaginal hysterectomy and bilateral salpingectomy. She has been followed At the [facility] and Gynecology division of [facility]. The patient complains of increasing dysmenorrhea that has not responded to medical management. An ultrasound showed a fibroid uterus and adenomyosis. Her most recent Pap smear show ASCUS. Her high risk HPV test was negative. An ultrasound showed that her endometrium measured 2.66 mm.  OB History    Gravida Para Term Preterm AB TAB SAB Ectopic Multiple Living   3 1 1  2  2   1       Past Medical History  Diagnosis Date  . Yeast infection     Hx  . HSV-2 (herpes simplex virus 2) infection   . Low iron     Hx  . H/O varicella   . Yeast infection   . Bacterial infection     Hx  . Abnormal Pap smear 11/2011  . Lower abdominal pain 12/25/2011  . SVD (spontaneous vaginal delivery)     x 1    No prescriptions prior to admission    Past Surgical History  Procedure Laterality Date  . Wisdom tooth extraction      No Known Allergies  Family History: family history includes Breast cancer in her mother; Cancer in her paternal grandmother; Diabetes in her father; Hypertension in her mother; Scoliosis in her daughter.  Social History:  reports that she has never smoked. She has never used smokeless tobacco. She reports that she drinks about 1.8 oz of alcohol per week. She reports that she does not use illicit drugs.  Review of systems: See HPI.  Admission Physical Exam:    There is no weight on file to calculate BMI.  There were no vitals taken for this visit.  HEENT:                 Within normal limits Chest:                   Clear Heart:                    Regular rate and rhythm Breasts:                No masses, skin changes, bleeding, or discharge present Abdomen:             Nontender,  no masses Extremities:          Grossly normal Neurologic exam: Grossly normal  Pelvic exam:  External genitalia: normal general appearance Vaginal: normal without tenderness, induration or masses Cervix: normal appearance Adnexa: normal bimanual exam Uterus: Upper limits normal size  Assessment:  Dysmenorrhea  Fibroid uterus  Adenomyosis  ASCUS Pap smear without high-risk HPV  Plan:  The patient will undergo a vaginal hysterectomy and bilateral salpingectomy. She understands the indications for her surgical procedure as well as her alternative treatment options. She accepts the risk of, but not limited to, anesthetic complications, bleeding, infections, and possible damage to the surrounding organs.   Eli Hose 12/02/2015

## 2015-12-02 NOTE — Op Note (Signed)
OPERATIVE NOTE  Loretta Sweeney  DOB:    12-31-1970  MRN:    [id number]  CSN:    [id number]  Date of Surgery:  12/02/2015  Preoperative Diagnosis:  Dysmenorrhea  Fibroid uterus  Adenomyosis  History of endometriosis  History of ASCUS Pap smear  Postoperative Diagnosis:  Same  Procedure:  Vaginal hysterectomy  Bilateral salpingectomy  Surgeon:  Gildardo Cranker, M.D.  Assistant:  Crawford Givens, M.D.  Anesthetic:  General  Disposition:  The patient presents with the above-mentioned diagnosis. She understands the indications for surgical procedure.  She also understands the alternative treatment options. She accepts the risk of, but not limited to, anesthetic complications, bleeding, infections, and possible damage to the surrounding organs.  Findings:  The uterus measured 135 g. The fallopian tubes appeared normal. A benign cyst was noted on the left ovary.  Procedure:  The patient was taken to the operating room where a general anesthetic was given. The patient's lower abdomen, perineum, and vagina were prepped with multiple layers of Betadine. A Foley catheter was placed in the bladder. The patient was sterilely draped. An examination under anesthesia was performed. Operative findings are mentioned above. The cervix was injected with half percent Marcaine with epinephrine. A circumferential incision was made around the cervix. The vaginal mucosa was advanced anteriorly and posteriorly. The anterior cul-de-sac and in the posterior cul-de-sac were sharply entered. Alternating from right to left the uterosacral ligaments, paracervical tissues, parametrial tissues, and uterine arteries were clamped, cut, sutured, and tied securely. The upper pedicles were then clamped and cut. The uterus was removed from the operative field. The upper pedicles were secured using free ties and then suture ligatures. The left fallopian tube was identified. The mesosalpinx was clamped  and cut removing the entire tube. Interrupted sutures were used for hemostasis. An identical procedure was carried out on the opposite side. Hemostasis was confirmed. The sutures attached to the uterosacral ligaments were brought out through the vaginal angles and then tied securely. A McCall culdoplasty suture was placed in the posterior cul-de-sac incorporating the uterosacral ligaments bilaterally and the posterior peritoneum. A final check was made for hemostasis and again hemostasis was confirmed. Exparel was injected along the suture lines. The vaginal cuff was closed using figure-of-eight sutures incorporating the anterior vaginal mucosa, the anterior peritoneum, posterior peritoneum, and the posterior vaginal mucosa. The McCall culdoplasty suture was tied securely and the apex of the vagina was noted to elevate into the midpelvis. Exparel was injected into the vaginal cuff. The patient tolerated her procedure well. She was awakened from her anesthetic without difficulty and then transported to the recovery room in stable condition. Sponge, needle, and instrument counts were correct on 2 occasions. The estimated blood loss was 125 cc's. 0 Vicryl is the suture material used throughout the procedure. The uterus and tubes were sent to pathology.  Gildardo Cranker, M.D.  12/02/2015

## 2015-12-02 NOTE — Anesthesia Postprocedure Evaluation (Signed)
Anesthesia Post Note  Patient: Loretta Sweeney  Procedure(s) Performed: Procedure(s) (LRB): HYSTERECTOMY VAGINAL (B) Salpingectomy (N/A)  Patient location during evaluation: [facility] Anesthesia Type: General Level of consciousness: awake, awake and alert, oriented and patient cooperative Pain management: pain level controlled Vital Signs Assessment: post-procedure vital signs reviewed and stable Respiratory status: spontaneous breathing, nonlabored ventilation and respiratory function stable Cardiovascular status: stable Postop Assessment: no headache, no backache, patient able to bend at knees and no signs of nausea or vomiting Anesthetic complications: no     Last Vitals:  Filed Vitals:   12/02/15 1245 12/02/15 1341  BP: 120/80 121/79  Pulse: 70 82  Temp: 36.6 C 36.8 C  Resp: 20 20    Last Pain:  Filed Vitals:   12/02/15 1353  PainSc: 2    Pain Goal: Patients Stated Pain Goal: 2 (12/02/15 1341)               Dail Lerew L

## 2015-12-02 NOTE — Anesthesia Procedure Notes (Signed)
Procedure Name: Intubation Date/Time: 12/02/2015 9:18 AM Performed by: Rayvon Char Pre-anesthesia Checklist: Emergency Drugs available, Patient identified, Patient being monitored and Suction available Patient Re-evaluated:Patient Re-evaluated prior to inductionOxygen Delivery Method: Circle system utilized Preoxygenation: Pre-oxygenation with 100% oxygen Intubation Type: IV induction Ventilation: Mask ventilation without difficulty Laryngoscope Size: Miller and 2 Grade View: Grade I Laser Tube: Cuffed inflated with minimal occlusive pressure - saline Tube size: 7.0 mm Number of attempts: 1 Airway Equipment and Method: Stylet Placement Confirmation: ETT inserted through vocal cords under direct vision,  positive ETCO2 and breath sounds checked- equal and bilateral Secured at: 22 (teeth) cm Tube secured with: Tape Dental Injury: Teeth and Oropharynx as per pre-operative assessment

## 2015-12-02 NOTE — Anesthesia Postprocedure Evaluation (Signed)
Anesthesia Post Note  Patient: Loretta Sweeney  Procedure(s) Performed: Procedure(s) (LRB): HYSTERECTOMY VAGINAL (B) Salpingectomy (N/A)  Patient location during evaluation: PACU Anesthesia Type: General Level of consciousness: sedated Pain management: pain level controlled Vital Signs Assessment: post-procedure vital signs reviewed and stable Respiratory status: spontaneous breathing and respiratory function stable Cardiovascular status: stable Anesthetic complications: no     Last Vitals:  Filed Vitals:   12/02/15 1245 12/02/15 1341  BP: 120/80 121/79  Pulse: 70 82  Temp: 36.6 C 36.8 C  Resp: 20 20    Last Pain:  Filed Vitals:   12/02/15 1353  PainSc: 2    Pain Goal: Patients Stated Pain Goal: 2 (12/02/15 1341)               Rony Ratz DANIEL

## 2015-12-02 NOTE — Transfer of Care (Signed)
Immediate Anesthesia Transfer of Care Note  Patient: Loretta Sweeney  Procedure(s) Performed: Procedure(s): HYSTERECTOMY VAGINAL (B) Salpingectomy (N/A)  Patient Location: PACU  Anesthesia Type:General  Level of Consciousness: awake, alert  and oriented  Airway & Oxygen Therapy: Patient Spontanous Breathing and Patient connected to nasal cannula oxygen  Post-op Assessment: Report given to RN and Post -op Vital signs reviewed and stable  Post vital signs: Reviewed and stable  Last Vitals:  Filed Vitals:   12/02/15 0838  BP: 127/91  Pulse: 85  Temp: 36.9 C  Resp: 20    Last Pain: There were no vitals filed for this visit.    Patients Stated Pain Goal: 2 ([id number])  Complications: No apparent anesthesia complications

## 2015-12-03 ENCOUNTER — Encounter (HOSPITAL_COMMUNITY): Payer: Self-pay | Admitting: Obstetrics and Gynecology

## 2015-12-03 DIAGNOSIS — N946 Dysmenorrhea, unspecified: Secondary | ICD-10-CM | POA: Diagnosis not present

## 2015-12-03 MED ORDER — OXYCODONE-ACETAMINOPHEN 5-325 MG PO TABS: 1.0000 | ORAL_TABLET | ORAL | Status: DC | PRN

## 2015-12-03 MED ORDER — IBUPROFEN 800 MG PO TABS: 800.0000 mg | ORAL_TABLET | Freq: Three times a day (TID) | ORAL | Status: DC | PRN

## 2015-12-03 MED ORDER — HYDROMORPHONE HCL 2 MG PO TABS: 2.0000 mg | ORAL_TABLET | ORAL | Status: DC | PRN

## 2015-12-03 MED ORDER — OXYCODONE-ACETAMINOPHEN 5-325 MG PO TABS
1.0000 | ORAL_TABLET | ORAL | Status: DC | PRN
  Administered 2015-12-03 (×2): 1 via ORAL
  Filled 2015-12-03 (×2): qty 1

## 2015-12-03 NOTE — Discharge Instructions (Signed)
Hysterectomy Information   A hysterectomy is a surgery in which your uterus is removed. This surgery may be done to treat various medical problems. After the surgery, you will no longer have menstrual periods. The surgery will also make you unable to become pregnant (sterile). The fallopian tubes and ovaries can be removed (bilateral salpingo-oophorectomy) during this surgery as well.   REASONS FOR A HYSTERECTOMY  · Persistent, abnormal bleeding.  · Lasting (chronic) pelvic pain or infection.  · The lining of the uterus (endometrium) starts growing outside the uterus (endometriosis).  · The endometrium starts growing in the muscle of the uterus (adenomyosis).  · The uterus falls down into the vagina (pelvic organ prolapse).  · Noncancerous growths in the uterus (uterine fibroids) that cause symptoms.  · Precancerous cells.  · Cervical cancer or uterine cancer.  TYPES OF HYSTERECTOMIES  · Supracervical hysterectomy--In this type, the top part of the uterus is removed, but not the cervix.  · Total hysterectomy--The uterus and cervix are removed.  · Radical hysterectomy--The uterus, the cervix, and the fibrous tissue that holds the uterus in place in the pelvis (parametrium) are removed.  WAYS A HYSTERECTOMY CAN BE PERFORMED  · Abdominal hysterectomy--A large surgical cut (incision) is made in the abdomen. The uterus is removed through this incision.  · Vaginal hysterectomy--An incision is made in the vagina. The uterus is removed through this incision. There are no abdominal incisions.  · Conventional laparoscopic hysterectomy--Three or four small incisions are made in the abdomen. A thin, lighted tube with a camera (laparoscope) is inserted into one of the incisions. Other tools are put through the other incisions. The uterus is cut into small pieces. The small pieces are removed through the incisions, or they are removed through the vagina.  · Laparoscopically assisted vaginal hysterectomy (LAVH)--Three or four  small incisions are made in the abdomen. Part of the surgery is performed laparoscopically and part vaginally. The uterus is removed through the vagina.  · Robot-assisted laparoscopic hysterectomy--A laparoscope and other tools are inserted into 3 or 4 small incisions in the abdomen. A computer-controlled device is used to give the surgeon a 3D image and to help control the surgical instruments. This allows for more precise movements of surgical instruments. The uterus is cut into small pieces and removed through the incisions or removed through the vagina.  RISKS AND COMPLICATIONS   Possible complications associated with this procedure include:  · Bleeding and risk of blood transfusion. Tell your health care provider if you do not want to receive any blood products.  · Blood clots in the legs or lung.  · Infection.  · Injury to surrounding organs.  · Problems or side effects related to anesthesia.  · Conversion to an abdominal hysterectomy from one of the other techniques.  WHAT TO EXPECT AFTER A HYSTERECTOMY  · You will be given pain medicine.  · You will need to have someone with you for the first 3-5 days after you go home.  · You will need to follow up with your surgeon in 2-4 weeks after surgery to evaluate your progress.  · You may have early menopause symptoms such as hot flashes, night sweats, and insomnia.  · If you had a hysterectomy for a problem that was not cancer or not a condition that could lead to cancer, then you no longer need Pap tests. However, even if you no longer need a Pap test, a regular exam is a good idea to make sure no   other problems are starting.     This information is not intended to replace advice given to you by your health care provider. Make sure you discuss any questions you have with your health care provider.     Document Released: 11/01/2000 Document Revised: 02/26/2013 Document Reviewed: 01/13/2013  Elsevier Interactive Patient Education ©2016 [employer] Inc[employer]

## 2015-12-03 NOTE — Discharge Summary (Signed)
  Physician Discharge Summary  Patient ID: Loretta Sweeney MRN: [id number] DOB/AGE: [birth year] 45 y.o.  Admit date:         12/02/2015 Discharge date: 12/03/2015  Admission Diagnoses:  Uterine Fibroids  Dysmenorrhea  Adenomyosis  Discharge Diagnoses:   Same  Procedures this Admission:  12/02/2015  Procedure(s) (LRB): HYSTERECTOMY VAGINAL (B) Salpingectomy (N/A)  Discharged Condition: good   Admission Hx and PE: The patient has been followed at the [facility]. She has a history of  Uterine Fibroids, dysmenorrhea, and adenomyosis. Please see her documented history and physical exam.   Hospital course:  On the day of admission, the patient underwent the following Procedure(s): HYSTERECTOMY VAGINAL (B) Salpingectomy. Operative findings included 135 g uterus. The fallopian tubes and ovaries appeared normal.. Her postoperative course was uneventful. She quickly tolerated a regular diet. Her postoperative pain was controlled with oral medication. She remained afebrile. Today she was felt to be ready for discharge.  Labs:  HEMOGLOBIN  Date Value Ref Range Status  12/02/2015 11.9* 12.0 - 15.0 g/dL Final   HCT  Date Value Ref Range Status  12/02/2015 35.6* 36.0 - 46.0 % Final    Consults: None  Final pathology report: Pending at the time of discharge  Disposition:  The patient will be discharged to home. She has been given a copy of the discharge instructions as prepared by the [facility] for patients who have undergone the Procedure(s): HYSTERECTOMY VAGINAL (B) Salpingectomy.      Medication List    STOP taking these medications        doxycycline 50 MG capsule  Commonly known as:  VIBRAMYCIN     NORTREL 1/35 (21) tablet  Generic drug:  norethindrone-ethinyl estradiol 1/35      TAKE these medications        acyclovir 400 MG tablet  Commonly known as:  ZOVIRAX  Take 400 mg by mouth as  needed.     fluticasone 50 MCG/ACT nasal spray  Commonly known as:  FLONASE  Place 1 spray into both nostrils daily.     HYDROmorphone 2 MG tablet  Commonly known as:  DILAUDID  Take 1 tablet (2 mg total) by mouth every 4 (four) hours as needed for severe pain.     ibuprofen 800 MG tablet  Commonly known as:  ADVIL,MOTRIN  Take 1 tablet (800 mg total) by mouth every 8 (eight) hours as needed.           Follow-up Information    Follow up with Eli Hose, MD In 6 weeks.   Specialty:  Obstetrics and Gynecology   Contact information:   [address] [place] [address] [phone]       Signed: Eli Hose 12/03/2015, 8:34 AM

## 2018-12-05 ENCOUNTER — Other Ambulatory Visit: Payer: Self-pay | Admitting: Rheumatology

## 2018-12-05 DIAGNOSIS — M25562 Pain in left knee: Secondary | ICD-10-CM

## 2018-12-15 ENCOUNTER — Other Ambulatory Visit: Payer: Self-pay

## 2018-12-15 ENCOUNTER — Ambulatory Visit
Admission: RE | Admit: 2018-12-15 | Discharge: 2018-12-15 | Disposition: A | Discharge status: Home / Self Care | Payer: Commercial Indemnity | Source: Ambulatory Visit | Attending: Rheumatology | Admitting: Rheumatology

## 2018-12-15 DIAGNOSIS — M25562 Pain in left knee: Secondary | ICD-10-CM

## 2019-01-18 ENCOUNTER — Other Ambulatory Visit: Payer: Self-pay

## 2019-01-18 ENCOUNTER — Encounter (HOSPITAL_COMMUNITY): Payer: Self-pay | Admitting: Emergency Medicine

## 2019-01-18 ENCOUNTER — Ambulatory Visit (HOSPITAL_COMMUNITY)
Admission: EM | Admit: 2019-01-18 | Discharge: 2019-01-18 | Disposition: A | Discharge status: Home / Self Care | Payer: 59 | Attending: Internal Medicine | Admitting: Internal Medicine

## 2019-01-18 DIAGNOSIS — K644 Residual hemorrhoidal skin tags: Secondary | ICD-10-CM

## 2019-01-18 MED ORDER — HYDROCORTISONE ACETATE 25 MG RE SUPP: 25.0000 mg | Freq: Two times a day (BID) | RECTAL | 0 refills | Status: DC

## 2019-01-18 MED ORDER — LIDOCAINE VISCOUS HCL 2 % MT SOLN: OROMUCOSAL | 0 refills | Status: DC

## 2019-01-18 MED ORDER — HYDROCORTISONE (PERIANAL) 2.5 % EX CREA: TOPICAL_CREAM | CUTANEOUS | 0 refills | Status: DC

## 2019-01-18 NOTE — ED Provider Notes (Signed)
[facility]    CSN: [id number] Arrival date & time: 01/18/19  1221      History   Chief Complaint Chief Complaint  Patient presents with   Appointment    55    HPI Loretta Sweeney is a 48 y.o. female. due to hemorrhoids flairring 4 days ago and OTC meds not helping. Has hx of constiopation and just this past few days has been taking stool softners. 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 Authorizing Provider  acyclovir (ZOVIRAX) 400 MG tablet Take 400 mg by mouth as needed.     [provider]  fluticasone (FLONASE) 50 MCG/ACT nasal spray Place 1 spray into both nostrils daily.    [provider]  ibuprofen (ADVIL,MOTRIN) 800 MG tablet Take 1 tablet (800 mg total) by mouth every 8 (eight) hours as needed. 12/03/15    Ena Dawley, MD  oxyCODONE-acetaminophen (ROXICET) 5-325 MG tablet Take 1 tablet by mouth every 4 (four) hours as needed for severe pain. Patient not taking: Reported on 01/18/2019 12/03/15   Ena Dawley, MD    Family History Family History  Problem Relation Age of Onset   Cancer Paternal Grandmother        stomach    Diabetes Father    Breast cancer Mother    Hypertension Mother    Scoliosis Daughter     Social History Social History   Tobacco Use   Smoking status: Never Smoker   Smokeless tobacco: Never Used  Substance Use Topics   Alcohol use: Yes    Alcohol/week: 3.0 standard drinks    Types: 3 Glasses of wine per week   Drug use: No     Allergies   Percocet [oxycodone-acetaminophen] and Tylenol [acetaminophen]   Review of Systems Review of Systems  Constitutional: Negative for chills, fatigue and fever.  Gastrointestinal: Positive for constipation and rectal pain. Negative for abdominal distention,  abdominal pain and blood in stool.  Skin: Negative for rash.   + for constipation, and seeing mild pink matter in the tissue today, but not rectal bleeding or abd. Pain.   Physical Exam Triage Vital Signs ED Triage Vitals  Enc Vitals Group     BP 01/18/19 1245 128/74     Pulse Rate 01/18/19 1245 87     Resp 01/18/19 1245 18     Temp 01/18/19 1245 98.6 F (37 C)     Temp Source 01/18/19 1245 Oral     SpO2 01/18/19 1245 100 %     Weight --      Height --      Head Circumference --      Peak Flow --      Pain Score 01/18/19 1246 7     Pain Loc --      Pain Edu? --      Excl. in [place]? --    No data found.  Updated Vital Signs BP 128/74 (BP Location: Right Arm)    Pulse 87    Temp 98.6 F (37 C) (Oral)    Resp 18    SpO2 100%   Visual Acuity Right Eye Distance:   Left Eye Distance:   Bilateral Distance:    Right Eye Near:   Left Eye Near:    Bilateral Near:     Physical Exam Vitals signs and nursing note reviewed.    Constitutional:      General: She is not in acute distress.    Appearance: Normal appearance. She is not toxic-appearing.  HENT:     Right Ear: External ear normal.     Left Ear: External ear normal.  Eyes:     General: No scleral icterus.    Conjunctiva/sclera: Conjunctivae normal.  Neck:     Musculoskeletal: Neck supple.  Pulmonary:     Effort: Pulmonary effort is normal.  Genitourinary:    Comments: Has a 1x2 cm skin color external hemorrhoid protruding from the L anal region. No signs of thrombosis noted.  I did not do digital exam since she could not tolerate it. No blood seen Skin:    General: Skin is warm.  Neurological:     Mental Status: She is alert and oriented to person, place, and time.     Gait: Gait normal.  Psychiatric:        Mood and Affect: Mood normal.        Behavior: Behavior normal.        Thought Content: Thought content normal.        Judgment: Judgment normal.      UC Treatments / Results  Labs (all labs ordered are listed, but only abnormal results are displayed) Labs Reviewed - No data to display  EKG   Radiology No results found.  Procedures Procedures (including critical care time)  Medications Ordered in UC Medications - No data to display  Initial Impression / Assessment and Plan / UC Course  I have reviewed the triage vital signs and the nursing notes. I placed her on annusol cream and sppository. Needs to do sits baths 2-4 times a day. I also sent rx for viscus xylocaine to apply on area of pain as prescribed. If she gets worse, needs to FU with PCP.  Final Clinical Impressions(s) / UC Diagnoses   Final diagnoses:  None   Discharge Instructions   None    ED Prescriptions    None  Controlled Substance Prescriptions [place] Controlled Substance Registry consulted?    Shelby Mattocks, [place] 01/18/19 2243

## 2019-01-18 NOTE — ED Triage Notes (Signed)
Pt here for hemorrhoids x 4 days with pain; pt sts OTC meds not helping

## 2019-01-18 NOTE — Discharge Instructions (Signed)
Continue doing sits baths 2-4 times a day and keep your stools soft. Sometimes you may form a clot inside the external hemorrhoid, which right now does not look it, and the pain may get worse instead of better. If that happens you need to be seen again, to get it removed.  Try to avoid lifting, prolonged sitting or standing until you are better.  You may take Tylenol or Ibuprofen as needed for pain as well as using the numbing jell I am prescribing.

## 2019-05-29 ENCOUNTER — Encounter: Payer: Self-pay | Admitting: Emergency Medicine

## 2019-05-29 ENCOUNTER — Other Ambulatory Visit: Payer: Self-pay

## 2019-05-29 ENCOUNTER — Ambulatory Visit
Admission: EM | Admit: 2019-05-29 | Discharge: 2019-05-29 | Disposition: A | Discharge status: Home / Self Care | Payer: 59 | Attending: Internal Medicine | Admitting: Internal Medicine

## 2019-05-29 DIAGNOSIS — B9789 Other viral agents as the cause of diseases classified elsewhere: Secondary | ICD-10-CM | POA: Insufficient documentation

## 2019-05-29 DIAGNOSIS — N946 Dysmenorrhea, unspecified: Secondary | ICD-10-CM | POA: Insufficient documentation

## 2019-05-29 DIAGNOSIS — Z79899 Other long term (current) drug therapy: Secondary | ICD-10-CM | POA: Diagnosis not present

## 2019-05-29 DIAGNOSIS — N941 Unspecified dyspareunia: Secondary | ICD-10-CM | POA: Insufficient documentation

## 2019-05-29 DIAGNOSIS — R519 Headache, unspecified: Secondary | ICD-10-CM | POA: Insufficient documentation

## 2019-05-29 DIAGNOSIS — A630 Anogenital (venereal) warts: Secondary | ICD-10-CM | POA: Insufficient documentation

## 2019-05-29 DIAGNOSIS — B009 Herpesviral infection, unspecified: Secondary | ICD-10-CM | POA: Insufficient documentation

## 2019-05-29 DIAGNOSIS — R0981 Nasal congestion: Secondary | ICD-10-CM | POA: Diagnosis not present

## 2019-05-29 DIAGNOSIS — M069 Rheumatoid arthritis, unspecified: Secondary | ICD-10-CM | POA: Diagnosis not present

## 2019-05-29 DIAGNOSIS — R05 Cough: Secondary | ICD-10-CM | POA: Insufficient documentation

## 2019-05-29 DIAGNOSIS — R079 Chest pain, unspecified: Secondary | ICD-10-CM | POA: Diagnosis not present

## 2019-05-29 DIAGNOSIS — U071 COVID-19: Secondary | ICD-10-CM | POA: Diagnosis not present

## 2019-05-29 DIAGNOSIS — J028 Acute pharyngitis due to other specified organisms: Secondary | ICD-10-CM | POA: Insufficient documentation

## 2019-05-29 DIAGNOSIS — Z791 Long term (current) use of non-steroidal anti-inflammatories (NSAID): Secondary | ICD-10-CM | POA: Diagnosis not present

## 2019-05-29 DIAGNOSIS — J029 Acute pharyngitis, unspecified: Secondary | ICD-10-CM | POA: Diagnosis not present

## 2019-05-29 DIAGNOSIS — R0982 Postnasal drip: Secondary | ICD-10-CM | POA: Insufficient documentation

## 2019-05-29 MED ORDER — BENZONATATE 100 MG PO CAPS: 100.0000 mg | ORAL_CAPSULE | Freq: Three times a day (TID) | ORAL | 0 refills | Status: DC | PRN

## 2019-05-29 NOTE — ED Provider Notes (Signed)
[facility]    CSN: [id number] Arrival date & time: 05/29/19  1246      History   Chief Complaint Chief Complaint  Patient presents with  . Cough    APPT  . Headache  . Generalized Body Aches    HPI Loretta Sweeney is a 49 y.o. female comes to urgent care for 4-day history of headaches, nonproductive cough, generalized body aches.  Did an E-visit this morning and was advised to come to the urgent care.  Symptoms started 3 to 4 days ago and has been persistent.  She admits to having some chills but no fever.  She has a cough which is associated with chest pain.  Cough is nonproductive.  She has not tried any over-the-counter medication.  She denies shortness of breath wheezing.  She complains of nasal congestion with postnasal drip and sore throat.  Patient has a history of rheumatoid arthritis and is currently on methotrexate.   No sick contacts.  Patient lives at home with her 48 year old daughter and a 61 year old husband.  HPI  Past Medical History:  Diagnosis Date  . Abnormal Pap smear 11/2011  . Bacterial infection    Hx  . H/O varicella   . HSV-2 (herpes simplex virus 2) infection   . Low iron    Hx  . Lower abdominal pain 12/25/2011  . SVD (spontaneous vaginal delivery)    x 1  . Yeast infection    Hx  . Yeast infection     Patient Active Problem List   Diagnosis Date Noted  . Dysmenorrhea 12/02/2015  . Dyspareunia 07/15/2012  . Dysplasia of cervix, low grade (CIN 1) 01/11/2012  . ASCUS on Pap smear 12/26/2011  . 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 ABDOMINAL HYSTERECTOMY    . VAGINAL HYSTERECTOMY N/A 12/02/2015   Procedure: HYSTERECTOMY VAGINAL (B) Salpingectomy;  Surgeon: Ena Dawley, MD;  Location: [facility];  Service: Gynecology;  Laterality: N/A;  . WISDOM TOOTH EXTRACTION      OB History    Gravida  3   Para  1   Term  1   Preterm      AB  2   Living  1     SAB  2   TAB      Ectopic      Multiple      Live Births  1            Home Medications    Prior to Admission medications   Medication Sig Start Date End Date Taking? Authorizing Provider  acyclovir (ZOVIRAX) 400 MG tablet Take 400 mg by mouth as needed.    Yes [provider]  fluticasone (FLONASE) 50 MCG/ACT nasal spray Place 1 spray into both nostrils daily.   Yes [provider]  ibuprofen (ADVIL,MOTRIN) 800 MG tablet Take 1 tablet (800 mg total) by mouth every 8 (eight) hours as needed. 12/03/15  Yes Ena Dawley, MD  methotrexate 2.5 MG tablet Take 15 mg by mouth once a week. 05/12/19  Yes [provider]  benzonatate (TESSALON) 100 MG capsule Take 1 capsule (100 mg total) by mouth 3 (three) times daily as needed for cough. 05/29/19   Sherika Kubicki, Myrene Galas, MD  folic acid (FOLVITE) 1 MG tablet Take 2 mg by mouth daily. 05/10/19   [provider]    Family History Family History  Problem Relation  Age of Onset  . Cancer Paternal Grandmother        stomach   . Diabetes Father   . Breast cancer Mother   . Hypertension Mother   . Scoliosis Daughter     Social History Social History   Tobacco Use  . Smoking status: Never Smoker  . Smokeless tobacco: Never Used  Substance Use Topics  . Alcohol use: Yes    Alcohol/week: 2.0 standard drinks    Types: 2 Glasses of wine per week  . Drug use: No     Allergies   Percocet [oxycodone-acetaminophen] and Tylenol [acetaminophen]   Review of Systems Review of Systems  Constitutional: Positive for activity change and chills. Negative for fatigue.  HENT: Positive for congestion, ear pain, postnasal drip, sinus pressure and sore throat. Negative for mouth sores, sinus pain, tinnitus and voice change.   Eyes: Negative for redness.  Respiratory: Positive for cough and chest tightness. Negative for apnea, shortness of breath and wheezing.   Cardiovascular: Negative.     Gastrointestinal: Negative for diarrhea, nausea and vomiting.  Genitourinary: Negative.   Musculoskeletal: Positive for myalgias.  Skin: Negative.   Neurological: Positive for headaches. Negative for dizziness and light-headedness.  Psychiatric/Behavioral: Negative for confusion and decreased concentration.     Physical Exam Triage Vital Signs ED Triage Vitals  Enc Vitals Group     BP 05/29/19 1302 (!) 143/99     Pulse Rate 05/29/19 1302 89     Resp 05/29/19 1302 18     Temp 05/29/19 1302 98.3 F (36.8 C)     Temp Source 05/29/19 1302 Oral     SpO2 05/29/19 1302 100 %     Weight 05/29/19 1303 150 lb (68 kg)     Height 05/29/19 1303 5\' 4"  (1.626 m)     Head Circumference --      Peak Flow --      Pain Score 05/29/19 1302 9     Pain Loc --      Pain Edu? --      Excl. in [place]? --    No data found.  Updated Vital Signs BP (!) 143/99 (BP Location: Left Arm)   Pulse 89   Temp 98.3 F (36.8 C) (Oral)   Resp 18   Ht 5\' 4"  (1.626 m)   Wt 68 kg   LMP 06/24/2012   SpO2 100%   BMI 25.75 kg/m   Visual Acuity Right Eye Distance:   Left Eye Distance:   Bilateral Distance:    Right Eye Near:   Left Eye Near:    Bilateral Near:     Physical Exam Constitutional:      General: She is not in acute distress.    Appearance: She is ill-appearing.  Eyes:     General: No visual field deficit.    Extraocular Movements: Extraocular movements intact.  Cardiovascular:     Rate and Rhythm: Normal rate and regular rhythm.  Pulmonary:     Effort: Pulmonary effort is normal.     Breath sounds: No wheezing or rhonchi.  Chest:     Chest wall: No tenderness.  Abdominal:     General: There is no distension.     Palpations: Abdomen is soft.     Tenderness: There is no abdominal tenderness. There is no guarding.  Musculoskeletal:        General: Normal range of motion.     Cervical back: Normal range of motion and neck supple. No rigidity.  Lymphadenopathy:     Cervical: No  cervical adenopathy.  Skin:    General: Skin is warm.  Neurological:     Mental Status: She is alert.     GCS: GCS eye subscore is 4. GCS verbal subscore is 5. GCS motor subscore is 6.     Cranial Nerves: No cranial nerve deficit, dysarthria or facial asymmetry.      UC Treatments / Results  Labs (all labs ordered are listed, but only abnormal results are displayed) Labs Reviewed  NOVEL CORONAVIRUS, NAA (HOSP ORDER, SEND-OUT TO REF LAB; TAT 18-24 HRS)    EKG   Radiology No results found.  Procedures Procedures (including critical care time)  Medications Ordered in UC Medications - No data to display  Initial Impression / Assessment and Plan / UC Course  I have reviewed the triage vital signs and the nursing notes.  Pertinent labs & imaging results that were available during my care of the patient were reviewed by me and considered in my medical decision making (see chart for details).     1.  Viral pharyngitis: COVID-19 testing sent Tessalon Perles as needed for cough Patient is advised to self isolate until COVID-19 test result is available If patient has worsening symptoms she is advised to return to the urgent care to be reevaluated. Final Clinical Impressions(s) / UC Diagnoses   Final diagnoses:  Viral pharyngitis     Discharge Instructions     Please self isolate until covid 19 test is available.Please continue to wear your mask, social distance.    ED Prescriptions    Medication Sig Dispense Auth. Provider   benzonatate (TESSALON) 100 MG capsule Take 1 capsule (100 mg total) by mouth 3 (three) times daily as needed for cough. 30 capsule Laurian Edrington, Myrene Galas, MD     PDMP not reviewed this encounter.   Chase Picket, MD 05/29/19 1640

## 2019-05-29 NOTE — Discharge Instructions (Signed)
Please self isolate until covid 19 test is available.Please continue to wear your mask, social distance.

## 2019-05-29 NOTE — ED Triage Notes (Signed)
Patient did an e-visit this morning. Patient was prescribed an antibiotic and cough medication and instructed to come for covid testing.

## 2019-05-29 NOTE — ED Triage Notes (Signed)
Patient in today c/o cough, headache, body aches x 3 days. Patient denies fever.

## 2019-05-31 ENCOUNTER — Encounter: Payer: Self-pay | Admitting: Nurse Practitioner

## 2019-05-31 ENCOUNTER — Other Ambulatory Visit: Payer: Self-pay | Admitting: Nurse Practitioner

## 2019-05-31 ENCOUNTER — Telehealth: Payer: Self-pay | Admitting: Nurse Practitioner

## 2019-05-31 ENCOUNTER — Encounter (HOSPITAL_COMMUNITY): Payer: Self-pay

## 2019-05-31 DIAGNOSIS — Z6825 Body mass index (BMI) 25.0-25.9, adult: Secondary | ICD-10-CM | POA: Insufficient documentation

## 2019-05-31 DIAGNOSIS — M059 Rheumatoid arthritis with rheumatoid factor, unspecified: Secondary | ICD-10-CM

## 2019-05-31 DIAGNOSIS — M069 Rheumatoid arthritis, unspecified: Secondary | ICD-10-CM | POA: Insufficient documentation

## 2019-05-31 DIAGNOSIS — U071 COVID-19: Secondary | ICD-10-CM | POA: Insufficient documentation

## 2019-05-31 NOTE — Progress Notes (Signed)
BAM infusion orders 

## 2019-05-31 NOTE — Telephone Encounter (Signed)
  I connected by phone with Loretta Sweeney on 05/31/2019 at 1:00 PM to discuss the potential use of an new treatment for mild to moderate COVID-19 viral infection in non-hospitalized patients.  She reports she had a headache on 05/26/2019, but symptoms started on 05/28/2019, went for testing 05/29/2019 which returned positive. She does have RA and is currently on treatment with methotrexate.    This patient is a 49 y.o. female that meets the FDA criteria for Emergency Use Authorization of bamlanivimab or casirivimab\imdevimab.  Has a (+) direct SARS-CoV-2 viral test result  Has mild or moderate COVID-19   Is ? 50 years of age and weighs ? 40 kg  Is NOT hospitalized due to COVID-19  Is NOT requiring oxygen therapy or requiring an increase in baseline oxygen flow rate due to COVID-19  Is within 10 days of symptom onset  Has at least one of the high risk factor(s) for progression to severe COVID-19 and/or hospitalization as defined in EUA.  Specific high risk criteria : Currently receiving immunosuppressive treatment -- methotrexate   I have spoken and communicated the following to the patient or parent/caregiver:  1. FDA has authorized the emergency use of bamlanivimab and casirivimab\imdevimab for the treatment of mild to moderate COVID-19 in adults and pediatric patients with positive results of direct SARS-CoV-2 viral testing who are 24 years of age and older weighing at least 40 kg, and who are at high risk for progressing to severe COVID-19 and/or hospitalization.  2. The significant known and potential risks and benefits of bamlanivimab and casirivimab\imdevimab, and the extent to which such potential risks and benefits are unknown.  3. Information on available alternative treatments and the risks and benefits of those alternatives, including clinical trials.  4. Patients treated with bamlanivimab and casirivimab\imdevimab should continue to self-isolate and use infection control measures  (e.g., wear mask, isolate, social distance, avoid sharing personal items, clean and disinfect "high touch" surfaces, and frequent handwashing) according to CDC guidelines.   5. The patient or parent/caregiver has the option to accept or refuse bamlanivimab or casirivimab\imdevimab .  After reviewing this information with the patient, The patient agreed to proceed with receiving the bamlanimivab infusion and will be provided a copy of the Fact sheet prior to receiving the infusion.Henrine Screws T  05/31/2019 1:00 PM

## 2019-06-04 ENCOUNTER — Encounter (HOSPITAL_COMMUNITY): Payer: Self-pay

## 2019-06-04 ENCOUNTER — Ambulatory Visit (HOSPITAL_COMMUNITY)
Admission: RE | Admit: 2019-06-04 | Discharge: 2019-06-04 | Disposition: A | Discharge status: Home / Self Care | Payer: 59 | Source: Ambulatory Visit | Attending: Pulmonary Disease | Admitting: Pulmonary Disease

## 2019-06-04 DIAGNOSIS — U071 COVID-19: Secondary | ICD-10-CM

## 2019-06-04 DIAGNOSIS — Z23 Encounter for immunization: Secondary | ICD-10-CM | POA: Insufficient documentation

## 2019-06-04 LAB — NOVEL CORONAVIRUS, NAA (HOSP ORDER, SEND-OUT TO REF LAB; TAT 18-24 HRS): SARS-CoV-2, NAA: DETECTED — AB

## 2019-06-04 MED ORDER — FAMOTIDINE IN NACL 20-0.9 MG/50ML-% IV SOLN: 20.0000 mg | Freq: Once | INTRAVENOUS | Status: DC | PRN

## 2019-06-04 MED ORDER — METHYLPREDNISOLONE SODIUM SUCC 125 MG IJ SOLR: 125.0000 mg | Freq: Once | INTRAMUSCULAR | Status: DC | PRN

## 2019-06-04 MED ORDER — ALBUTEROL SULFATE HFA 108 (90 BASE) MCG/ACT IN AERS: 2.0000 | INHALATION_SPRAY | Freq: Once | RESPIRATORY_TRACT | Status: DC | PRN

## 2019-06-04 MED ORDER — EPINEPHRINE 0.3 MG/0.3ML IJ SOAJ: 0.3000 mg | Freq: Once | INTRAMUSCULAR | Status: DC | PRN

## 2019-06-04 MED ORDER — SODIUM CHLORIDE 0.9 % IV SOLN
700.0000 mg | Freq: Once | INTRAVENOUS | Status: AC
  Administered 2019-06-04: 09:00:00 700 mg via INTRAVENOUS
  Filled 2019-06-04: qty 20

## 2019-06-04 MED ORDER — SODIUM CHLORIDE 0.9 % IV SOLN
INTRAVENOUS | Status: DC | PRN
  Administered 2019-06-04: 250 mL via INTRAVENOUS

## 2019-06-04 MED ORDER — DIPHENHYDRAMINE HCL 50 MG/ML IJ SOLN: 50.0000 mg | Freq: Once | INTRAMUSCULAR | Status: DC | PRN

## 2019-06-04 NOTE — Discharge Instructions (Signed)

## 2019-06-04 NOTE — Progress Notes (Signed)
  Diagnosis: COVID-19  Physician: Dr. Joya Gaskins  Procedure: [facility] Infusion [facility] Med: bamlanivimab infusion - Provided patient with bamlanimivab fact sheet for patients, parents and caregivers prior to infusion.  Complications: No immediate complications noted.  Discharge: Discharged home   Loretta Sweeney N Vennesa Bastedo 06/04/2019

## 2019-06-30 ENCOUNTER — Other Ambulatory Visit: Payer: Self-pay

## 2019-06-30 ENCOUNTER — Other Ambulatory Visit: Payer: Self-pay | Admitting: Podiatry

## 2019-06-30 ENCOUNTER — Ambulatory Visit (INDEPENDENT_AMBULATORY_CARE_PROVIDER_SITE_OTHER): Payer: 59

## 2019-06-30 ENCOUNTER — Ambulatory Visit (INDEPENDENT_AMBULATORY_CARE_PROVIDER_SITE_OTHER): Payer: 59 | Admitting: Podiatry

## 2019-06-30 DIAGNOSIS — M84374A Stress fracture, right foot, initial encounter for fracture: Secondary | ICD-10-CM

## 2019-06-30 DIAGNOSIS — M79671 Pain in right foot: Secondary | ICD-10-CM

## 2019-06-30 MED ORDER — DICLOFENAC SODIUM 75 MG PO TBEC: 75.0000 mg | DELAYED_RELEASE_TABLET | Freq: Two times a day (BID) | ORAL | 1 refills | Status: DC

## 2019-07-01 NOTE — Progress Notes (Signed)
   HPI: 49 y.o. female presenting today as a new patient with a chief complaint of pain to the dorsal right foot that began one month ago. She states she went to bed on 05/25/2019 and woke up with the pain. She has taken a course of Prednisone for treatment. Palpating the area increases the pain. She denies any known trauma or injury. Patient is here for further evaluation and treatment.   Past Medical History:  Diagnosis Date  . Abnormal Pap smear 11/2011  . Bacterial infection    Hx  . H/O varicella   . HSV-2 (herpes simplex virus 2) infection   . Low iron    Hx  . Lower abdominal pain 12/25/2011  . SVD (spontaneous vaginal delivery)    x 1  . Yeast infection    Hx  . Yeast infection      Physical Exam: General: The patient is alert and oriented x3 in no acute distress.  Dermatology: Skin is warm, dry and supple bilateral lower extremities. Negative for open lesions or macerations.  Vascular: Palpable pedal pulses bilaterally. No edema or erythema noted. Capillary refill within normal limits.  Neurological: Epicritic and protective threshold grossly intact bilaterally.   Musculoskeletal Exam: Pain with palpation noted to the third metatarsal of the right foot. Range of motion within normal limits to all pedal and ankle joints bilateral. Muscle strength 5/5 in all groups bilateral.   Assessment: 1. Stress fracture right 3rd metatarsal    Plan of Care:  1. Patient evaluated. X-Rays from disc reviewed.  2. CAM boot dispensed.  3. Prescription for Diclofenac provided to patient. 4. Return to clinic in 4 weeks for follow up X-Ray.   Cosmetologist.      Edrick Kins, DPM [facility]  Dr. Edrick Kins, DPM    [address], [place] [address]                Office [phone]  Fax [phone]

## 2019-07-28 ENCOUNTER — Other Ambulatory Visit: Payer: Self-pay

## 2019-07-28 ENCOUNTER — Ambulatory Visit (INDEPENDENT_AMBULATORY_CARE_PROVIDER_SITE_OTHER): Payer: 59 | Admitting: Podiatry

## 2019-07-28 ENCOUNTER — Ambulatory Visit (INDEPENDENT_AMBULATORY_CARE_PROVIDER_SITE_OTHER): Payer: 59

## 2019-07-28 DIAGNOSIS — M84374A Stress fracture, right foot, initial encounter for fracture: Secondary | ICD-10-CM

## 2019-07-31 NOTE — Progress Notes (Signed)
   HPI: 49 y.o. female presenting today for follow up evaluation of a stress fracture of the 3rd metatarsal of the right foot. She states she is doing well and has improved. She has not been taking the Diclofenac but has been elevating the foot for treatment. She has also been using the CAM boot as directed. There are no worsening factors noted. Patient is here for further evaluation and treatment.   Past Medical History:  Diagnosis Date  . Abnormal Pap smear 11/2011  . Bacterial infection    Hx  . H/O varicella   . HSV-2 (herpes simplex virus 2) infection   . Low iron    Hx  . Lower abdominal pain 12/25/2011  . SVD (spontaneous vaginal delivery)    x 1  . Yeast infection    Hx  . Yeast infection      Physical Exam: General: The patient is alert and oriented x3 in no acute distress.  Dermatology: Skin is warm, dry and supple bilateral lower extremities. Negative for open lesions or macerations.  Vascular: Palpable pedal pulses bilaterally. No edema or erythema noted. Capillary refill within normal limits.  Neurological: Epicritic and protective threshold grossly intact bilaterally.   Musculoskeletal Exam: Pain with palpation noted to the third metatarsal of the right foot. Range of motion within normal limits to all pedal and ankle joints bilateral. Muscle strength 5/5 in all groups bilateral.   Radiographic Exam:  Normal osseous mineralization. Joint spaces preserved. No fracture/dislocation/boney destruction.    Assessment: 1. Stress fracture right 3rd metatarsal with routine healing    Plan of Care:  1. Patient evaluated. X-Rays reviewed.   2. Discontinue using CAM boot.  3. May resume full activity with no restrictions.  4. Return to clinic as needed.    Cosmetologist.      Edrick Kins, DPM [facility]  Dr. Edrick Kins, DPM    [address], [place] [address]                Office [phone]    Fax [phone]

## 2019-09-09 ENCOUNTER — Other Ambulatory Visit: Payer: Self-pay

## 2019-09-09 ENCOUNTER — Encounter: Payer: 59 | Admitting: Podiatry

## 2019-09-09 ENCOUNTER — Ambulatory Visit: Payer: 59

## 2019-09-10 ENCOUNTER — Ambulatory Visit (INDEPENDENT_AMBULATORY_CARE_PROVIDER_SITE_OTHER): Payer: 59 | Admitting: Podiatry

## 2019-09-10 ENCOUNTER — Ambulatory Visit (INDEPENDENT_AMBULATORY_CARE_PROVIDER_SITE_OTHER): Payer: 59

## 2019-09-10 VITALS — Temp 98.0°F

## 2019-09-10 DIAGNOSIS — M84374S Stress fracture, right foot, sequela: Secondary | ICD-10-CM | POA: Diagnosis not present

## 2019-09-10 DIAGNOSIS — M84374A Stress fracture, right foot, initial encounter for fracture: Secondary | ICD-10-CM

## 2019-09-10 MED ORDER — MELOXICAM 15 MG PO TABS: 15.0000 mg | ORAL_TABLET | Freq: Every day | ORAL | 1 refills | Status: DC

## 2019-09-14 NOTE — Progress Notes (Signed)
This encounter was created in error - please disregard.

## 2019-09-15 NOTE — Progress Notes (Signed)
   HPI: 49 y.o. female presenting today for follow up evaluation of a stress fracture of the 3rd metatarsal of the right foot. She reports continued moderate pain and states she does not feel like it is improving. She has been using the CAM boot with no significant relief. She denies taking any pain medication. There are no specific worsening factors noted. Patient is here for further evaluation and treatment.   Past Medical History:  Diagnosis Date  . Abnormal Pap smear 11/2011  . Bacterial infection    Hx  . H/O varicella   . HSV-2 (herpes simplex virus 2) infection   . Low iron    Hx  . Lower abdominal pain 12/25/2011  . SVD (spontaneous vaginal delivery)    x 1  . Yeast infection    Hx  . Yeast infection      Physical Exam: General: The patient is alert and oriented x3 in no acute distress.  Dermatology: Skin is warm, dry and supple bilateral lower extremities. Negative for open lesions or macerations.  Vascular: Palpable pedal pulses bilaterally. No edema or erythema noted. Capillary refill within normal limits.  Neurological: Epicritic and protective threshold grossly intact bilaterally.   Musculoskeletal Exam: Pain with palpation noted to the third metatarsal of the right foot. Range of motion within normal limits to all pedal and ankle joints bilateral. Muscle strength 5/5 in all groups bilateral.   Radiographic Exam:  Normal osseous mineralization. Joint spaces preserved. No fracture/dislocation/boney destruction.    Assessment: 1. Stress fracture right 3rd metatarsal with routine healing    Plan of Care:  1. Patient evaluated. X-Rays reviewed.   2. Discontinue using CAM boot.  3. Post op shoe dispensed.  4. Prescription for Meloxicam provided to patient. 5. Return to clinic in 6 weeks.     Cosmetologist.      Edrick Kins, DPM [facility]  Dr. Edrick Kins, DPM    [address], [place]  [address]                Office [phone]  Fax [phone]

## 2019-10-20 IMAGING — MR MRI OF THE LEFT KNEE WITHOUT CONTRAST
4 of 7 series · 22 of 40 positions shown · non-contrast
Comparison: None.

CLINICAL DATA: Injured knee 08/28/2018.  Persistent pain.

EXAM:
MRI OF THE LEFT KNEE WITHOUT CONTRAST
TECHNIQUE: Multiplanar, multisequence MR imaging of the knee was performed. No
intravenous contrast was administered.

[Series 4: T2 fat-sat · coronal · 4.0mm · 0.59mm/px · 5 of 22 slices shown (1 of 2)]
[im 1/22]
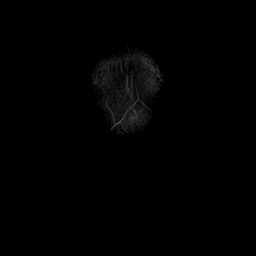
[im 6/22]
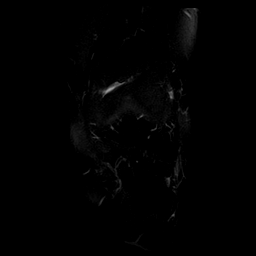
[im 11/22]
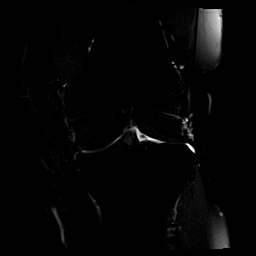
[im 16/22]
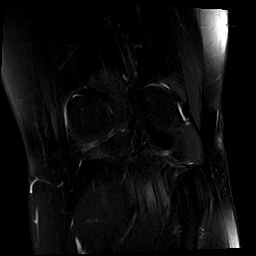
[im 22/22]
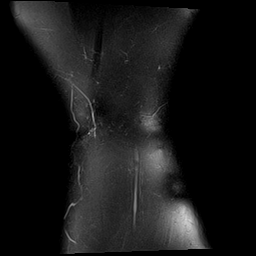

[Series 5: T1 · coronal · 4.0mm · 0.29mm/px · 3 of 24 slices shown]
[im 1/24]
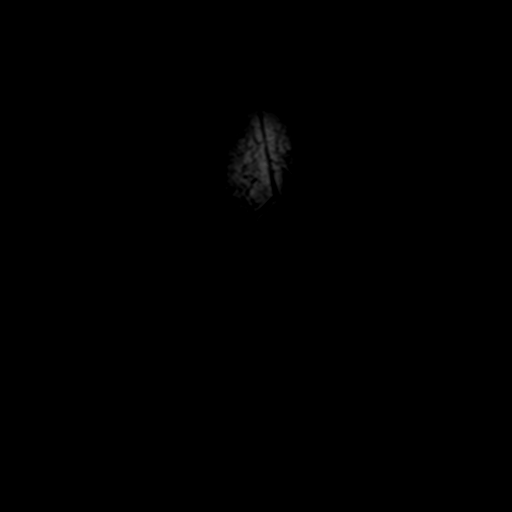
[im 12/24]
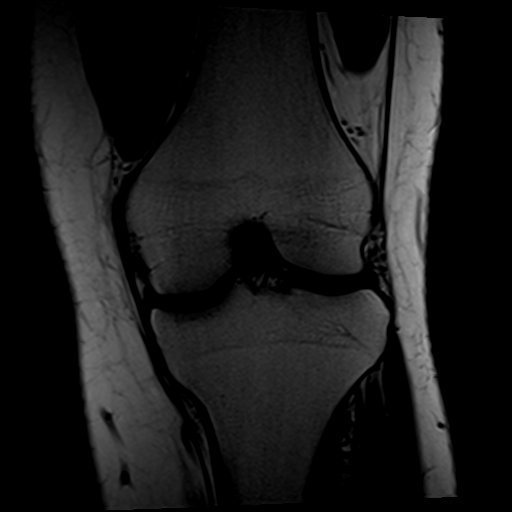
[im 24/24]
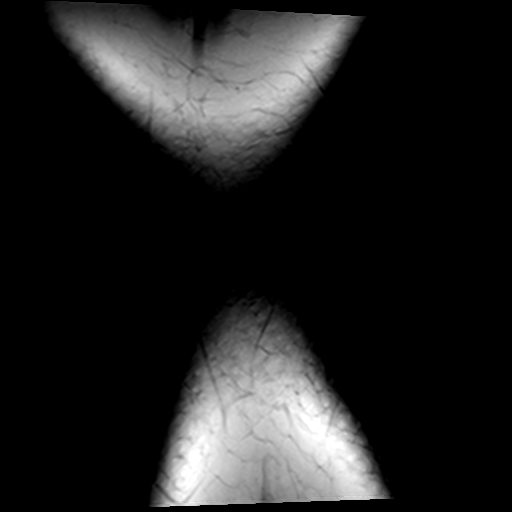

[Series 7: T2 fat-sat · sagittal · 3.0mm · 0.29mm/px · 7 of 30 slices shown (2 of 2)]
[im 1/30]
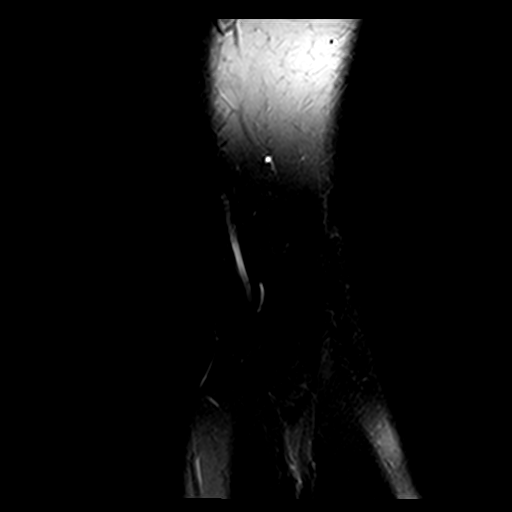
[im 5/30]
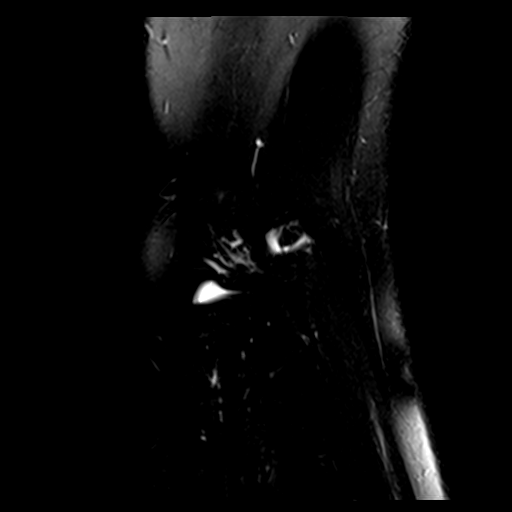
[im 10/30]
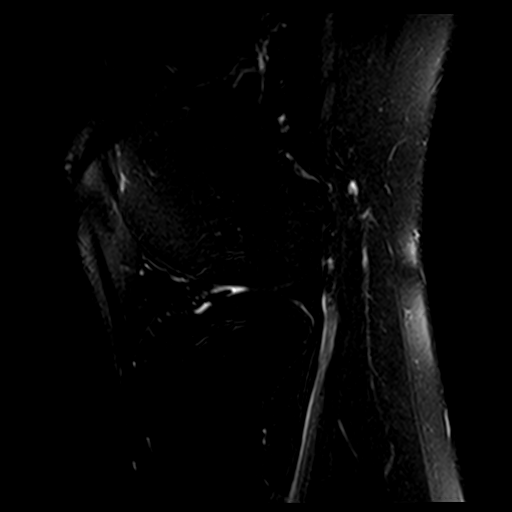
[im 15/30]
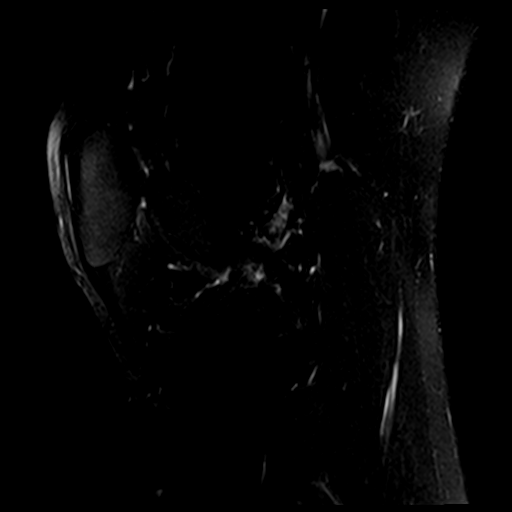
[im 20/30]
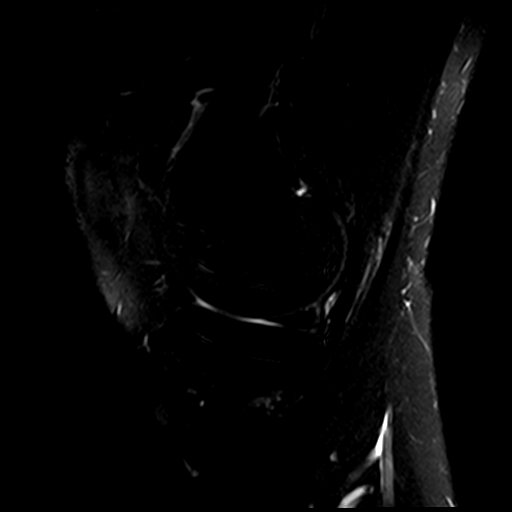
[im 25/30]
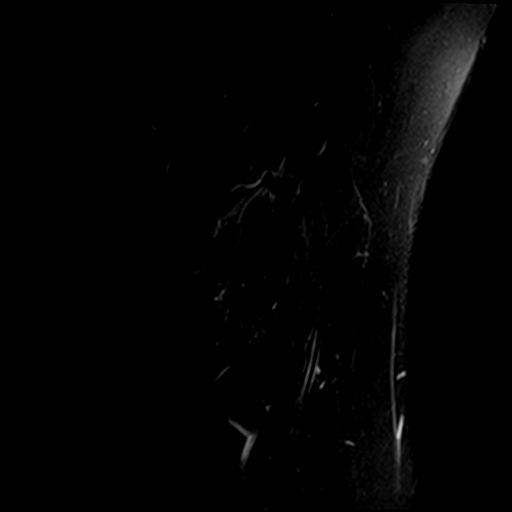
[im 30/30]
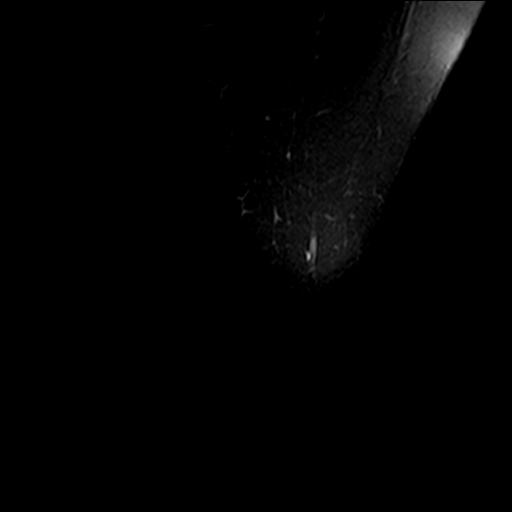

[Series 8: PD fat-sat · sagittal · 3.0mm · 0.29mm/px · 7 of 30 slices shown]
[im 1/30]
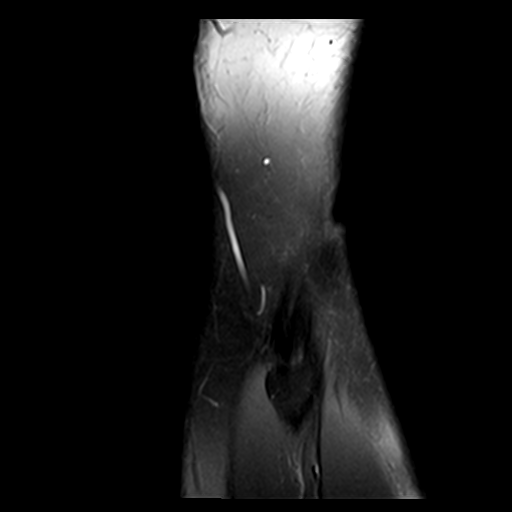
[im 5/30]
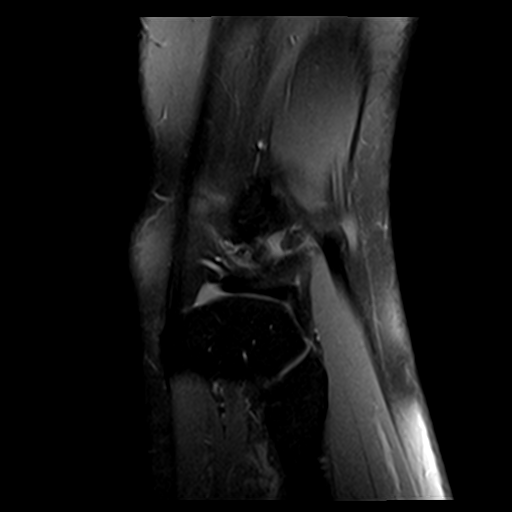
[im 10/30]
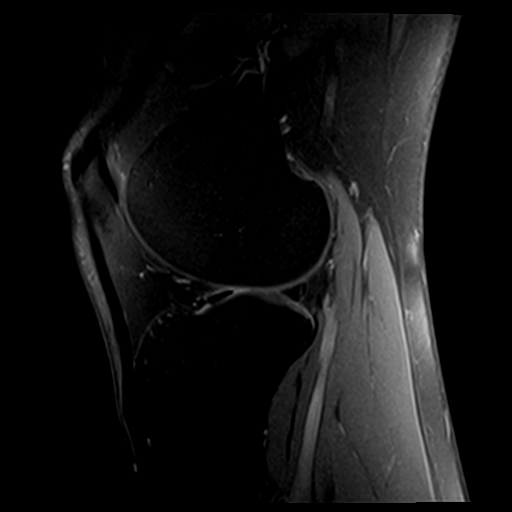
[im 15/30]
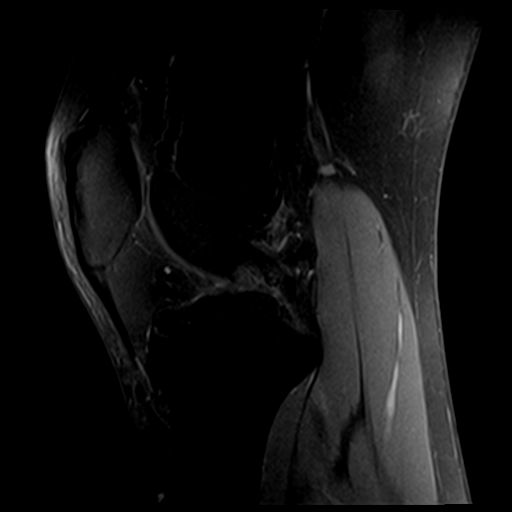
[im 20/30]
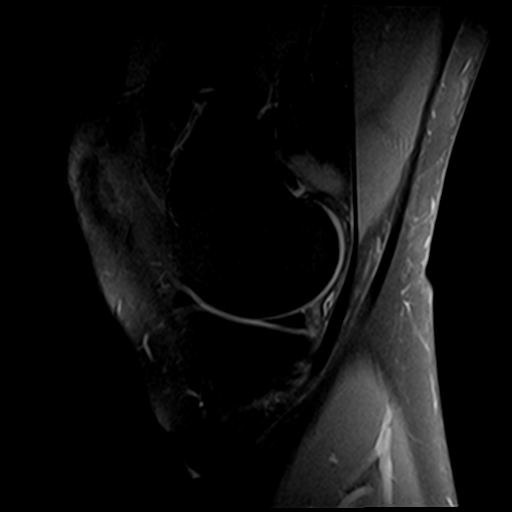
[im 25/30]
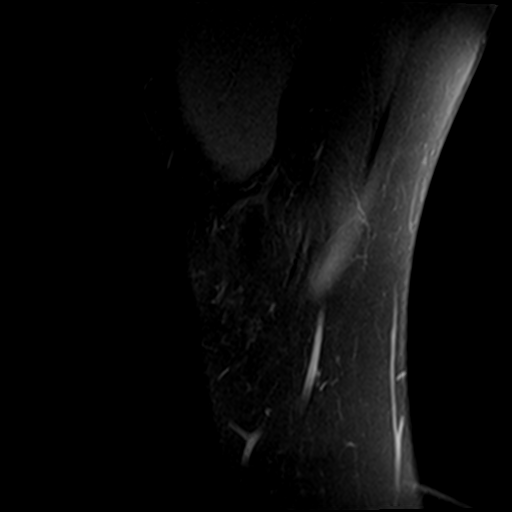
[im 30/30]
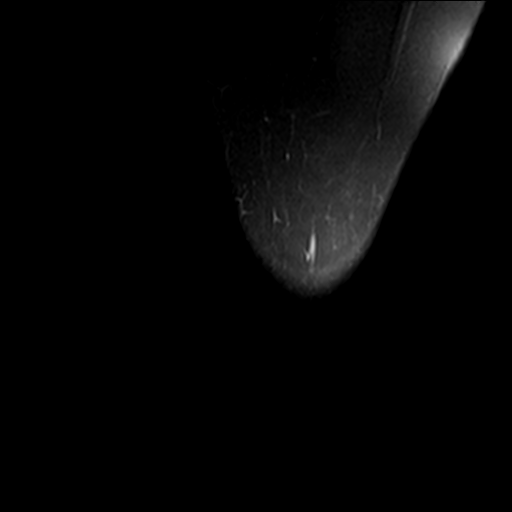

[22 of 40 positions shown; findings below may reference images not displayed]

FINDINGS: MENISCI

Medial meniscus:  Intact

Lateral meniscus:  Intact

LIGAMENTS

Cruciates:  Intact

Collaterals:  Intact

CARTILAGE

Patellofemoral:  Minimal degenerative chondrosis/chondromalacia.

Medial:  Minimal degenerative chondrosis.

Lateral:  Minimal degenerative chondrosis.

Joint:  Joint no joint effusion.

Popliteal Fossa:  No popliteal mass or Baker's cyst.

Extensor Mechanism: The patella retinacular structures are intact.
The quadriceps and patellar tendons are intact. Mild proximal
patellar tendinopathy. The patella appears normally located in the
trochlear notch. I do not see any findings for lateral patellar
compression syndrome.

Bones:  No acute bony findings.  No bone lesions.

Other: Normal knee musculature.
IMPRESSION: 1. Intact ligamentous structures and no acute bony findings.
2. No meniscal tears.
3. Minimal degenerative chondrosis/chondromalacia. No cartilage
defects or osteochondral lesions.
4. Mild proximal patellar tendinopathy.
5. No joint effusion or Baker's cyst.

## 2019-10-22 ENCOUNTER — Ambulatory Visit: Payer: 59 | Admitting: Podiatry

## 2020-09-08 ENCOUNTER — Ambulatory Visit (INDEPENDENT_AMBULATORY_CARE_PROVIDER_SITE_OTHER): Payer: 59

## 2020-09-08 ENCOUNTER — Ambulatory Visit (INDEPENDENT_AMBULATORY_CARE_PROVIDER_SITE_OTHER): Payer: 59 | Admitting: Podiatry

## 2020-09-08 ENCOUNTER — Other Ambulatory Visit: Payer: Self-pay

## 2020-09-08 DIAGNOSIS — M7751 Other enthesopathy of right foot: Secondary | ICD-10-CM

## 2020-09-08 DIAGNOSIS — M79671 Pain in right foot: Secondary | ICD-10-CM

## 2020-09-08 NOTE — Progress Notes (Signed)
   HPI: 50 y.o. female presenting today for new complaint regarding pain and tenderness to the right forefoot is been going on for a few months now.  Patient states that she feels as if she is fractured the toe which she has in the past.  The pain has been most severe over the last 2-3 months.  She states that stretching and standing and walking on her foot is symptomatic.  She has not done anything for treatment.  Past Medical History:  Diagnosis Date  . Abnormal Pap smear 11/2011  . Bacterial infection    Hx  . H/O varicella   . HSV-2 (herpes simplex virus 2) infection   . Low iron    Hx  . Lower abdominal pain 12/25/2011  . SVD (spontaneous vaginal delivery)    x 1  . Yeast infection    Hx  . Yeast infection      Physical Exam: General: The patient is alert and oriented x3 in no acute distress.  Dermatology: Skin is warm, dry and supple bilateral lower extremities. Negative for open lesions or macerations.  Vascular: Palpable pedal pulses bilaterally. No edema or erythema noted. Capillary refill within normal limits.  Neurological: Epicritic and protective threshold grossly intact bilaterally.   Musculoskeletal Exam: Range of motion within normal limits to all pedal and ankle joints bilateral. Muscle strength 5/5 in all groups bilateral.  Pain on palpation range of motion to the third MTPJ right consistent with a metatarsophalangeal capsulitis  Radiographic Exam:  Normal osseous mineralization. Joint spaces preserved. No fracture/dislocation/boney destruction.    Assessment: 1.  Third MTPJ capsulitis right   Plan of Care:  1. Patient evaluated. X-Rays reviewed.  2.  Patient declined injection today 3.  Prescription for Medrol Dosepak 4.  Prescription for meloxicam 15 mg daily after completion of the Dosepak 5.  Offloading felt metatarsal pads were applied to the insoles of the shoes 6.  Return to clinic as needed      Edrick Kins, DPM [facility]  Dr. Edrick Kins, DPM    [address], [place] [address]                Office [phone]  Fax [phone]

## 2020-09-09 ENCOUNTER — Telehealth: Payer: Self-pay | Admitting: Podiatry

## 2020-09-09 MED ORDER — METHYLPREDNISOLONE 4 MG PO TBPK: ORAL_TABLET | ORAL | 0 refills | Status: DC

## 2020-09-09 NOTE — Telephone Encounter (Signed)
Pt called stating her Rx for Prednisone was never called in. Please advise.

## 2020-09-09 NOTE — Telephone Encounter (Signed)
Rx sent 

## 2020-09-27 ENCOUNTER — Other Ambulatory Visit: Payer: Self-pay | Admitting: Podiatry

## 2020-09-27 DIAGNOSIS — M7751 Other enthesopathy of right foot: Secondary | ICD-10-CM

## 2021-01-21 ENCOUNTER — Other Ambulatory Visit: Payer: Self-pay

## 2021-01-21 ENCOUNTER — Ambulatory Visit
Admission: EM | Admit: 2021-01-21 | Discharge: 2021-01-21 | Disposition: A | Discharge status: Home / Self Care | Payer: 59

## 2021-01-21 ENCOUNTER — Encounter: Payer: Self-pay | Admitting: Emergency Medicine

## 2021-01-21 DIAGNOSIS — W57XXXA Bitten or stung by nonvenomous insect and other nonvenomous arthropods, initial encounter: Secondary | ICD-10-CM

## 2021-01-21 DIAGNOSIS — S90861A Insect bite (nonvenomous), right foot, initial encounter: Secondary | ICD-10-CM

## 2021-01-21 NOTE — ED Triage Notes (Signed)
Pt said Wednesday she had an itch on her right foot and then noticed a mark and Thursday got worse with swelling and redness. Not sure what bit her.

## 2021-01-21 NOTE — ED Provider Notes (Signed)
Chief Complaint   Chief Complaint  Patient presents with   Insect Bite     Subjective, HPI  Loretta Sweeney is a very pleasant 50 y.o. female who presents with insect bite to the right foot which occurred on Wednesday.  Patient reports that on Thursday her itching was worse.  She now reports swelling and redness to the right foot.  She is uncertain of which type of insect could have bitten her.  No fever, chills, vomiting.  History obtained from patient.   Patient's problem list, past medical and social history, medications, and allergies were reviewed by me and updated in Epic.    ROS  See HPI.  Objective   Vitals:   01/21/21 1555  BP: (!) 147/80  Pulse: 74  Resp: 16  Temp: 98.7 F (37.1 C)  SpO2: 99%    Vital signs and nursing note reviewed.  General: Appears well-developed and well-nourished. No acute distress.  HEENT: Normocephalic, atraumatic, hearing grossly intact. EOMI, no drainage. No rhinorrhea. Moist mucous membranes.  Neck: Normal range of motion, neck is supple.  Cardiovascular: Normal rate.  Pulm/Chest: No respiratory distress.   Musculoskeletal: No joint deformity, normal range of motion.  Skin: Erythematous insect bite noted to lateral right foot with adjacent mild erythema.  Mild swelling noted to right foot dorsally.  No red streaking or purulent drainage.  Data  No results found for any visits on 01/21/21.   Assessment & Plan  1. Insect bite of right foot, initial encounter  50 y.o. female presents with nsect bite to the right foot which occurred on Wednesday.  Patient reports that on Thursday her itching was worse.  She now reports swelling and redness to the right foot.  She is uncertain of which type of insect could have bitten her.  No fever, chills, vomiting.  Chart review completed.  Given symptoms along with assessment findings, likely insect bite with allergic reaction/inflammatory response.  Patient states that she has been given a prescription  for prednisone and states that this is a new prescription.  Advised of how she should take this medication at home for current symptoms.  Also advised the use of Allegra and famotidine as directed.  Advised to apply topical hydrocortisone cream twice daily to the area and to keep the right foot elevated and iced throughout the day to help with swelling.  Advised to follow-up with PCP if the area does not improve in 5 to 6 days or sooner if the area worsens or she begins to develop a fever.  Patient verbalized understanding and agreed with plan.  Patient stable upon discharge.  Return as needed.  Plan:   Discharge Instructions      Use Prednisone as previously prescribed. Start with '50mg'$  then decrease by '10mg'$  daily until completing the course. Increase fluid intake. You may take Allegra daily, as discussed. You may also take over the counter Famotidine '20mg'$  once daily to help with itching/inflammation. Apply topical hydrocortisone cream which can be purchased over the counter to the bites twice daily. You may apply ice wrapped in a towel to affected area 3-5 times daily for 10-15 minute intervals. See your PCP if area does not improve in 5-6 days. See your PCP or return to clinic sooner if area worsens or you develop fever.          Serafina Royals, [place] 01/21/21 1627

## 2021-01-21 NOTE — Discharge Instructions (Signed)
Use Prednisone as previously prescribed. Start with '50mg'$  then decrease by '10mg'$  daily until completing the course. Increase fluid intake. You may take Allegra daily, as discussed. You may also take over the counter Famotidine '20mg'$  once daily to help with itching/inflammation. Apply topical hydrocortisone cream which can be purchased over the counter to the bites twice daily. You may apply ice wrapped in a towel to affected area 3-5 times daily for 10-15 minute intervals. See your PCP if area does not improve in 5-6 days. See your PCP or return to clinic sooner if area worsens or you develop fever.

## 2021-04-19 ENCOUNTER — Ambulatory Visit
Admission: EM | Admit: 2021-04-19 | Discharge: 2021-04-19 | Disposition: A | Discharge status: Home / Self Care | Payer: 59

## 2021-04-19 ENCOUNTER — Other Ambulatory Visit: Payer: Self-pay

## 2021-04-19 ENCOUNTER — Encounter: Payer: Self-pay | Admitting: Emergency Medicine

## 2021-04-19 DIAGNOSIS — B349 Viral infection, unspecified: Secondary | ICD-10-CM | POA: Diagnosis not present

## 2021-04-19 MED ORDER — BENZONATATE 100 MG PO CAPS: 100.0000 mg | ORAL_CAPSULE | Freq: Three times a day (TID) | ORAL | 0 refills | Status: DC | PRN

## 2021-04-19 NOTE — Discharge Instructions (Addendum)
Your COVID and Flu tests are pending.  You should self quarantine until the test results are back.    Take the [facility] as needed for cough.  Take Tylenol or ibuprofen as needed for fever or discomfort.  Rest and keep yourself hydrated.    Follow-up with your primary care provider if your symptoms are not improving.

## 2021-04-19 NOTE — ED Triage Notes (Signed)
Pt c/o dry cough, chest congestion and tightness that started yesterday.

## 2021-04-19 NOTE — ED Provider Notes (Signed)
Roderic Palau    CSN: [id number] Arrival date & time: 04/19/21  0820      History   Chief Complaint Chief Complaint  Patient presents with   Cough    HPI Loretta Sweeney is a 50 y.o. female.  Patient presents with 1 day history of nonproductive cough, congestion, postnasal drip, sore throat, headache, body aches.  No fever, shortness of breath, vomiting, diarrhea, or other symptoms.  Treatment with ibuprofen.  Her medical history includes rheumatoid arthritis.  The history is provided by the patient and medical records.   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/11/2012   ASCUS on Pap smear 12/26/2011   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 Authorizing Provider  acyclovir (ZOVIRAX) 400 MG tablet Take 400 mg by mouth as needed.    Yes  [provider]  benzonatate (TESSALON) 100 MG capsule Take 1 capsule (100 mg total) by mouth 3 (three) times daily as needed for cough. 04/19/21  Yes Sharion Balloon, NP  folic acid (FOLVITE) 1 MG tablet Take 2 mg by mouth daily. 05/10/19  Yes [provider]  methotrexate 2.5 MG tablet Take 15 mg by mouth once a week. 05/12/19  Yes [provider]  brompheniramine-pseudoephedrine-DM 30-2-10 MG/5ML syrup Take 5 mLs by mouth 3 (three) times daily as needed. 05/29/19   [provider]  CLENPIQ 10-3.5-12 MG-GM -GM/160ML SOLN See admin instructions. 03/16/20   [provider]  clobetasol (OLUX) 0.05 % topical foam clobetasol 0.05 % topical foam    [provider]  clotrimazole-betamethasone (LOTRISONE) cream clotrimazole-betamethasone 1 %-0.05 % topical cream    [provider]  diclofenac (VOLTAREN) 75 MG EC tablet Take 1 tablet (75 mg total) by mouth 2 (two) times daily. 06/30/19   Edrick Kins, DPM  doxycycline (VIBRAMYCIN) 50 MG capsule Take 50 mg by mouth daily. 05/04/20   [provider]  estradiol (  VIVELLE-DOT) 0.05 MG/24HR patch 1 patch 2 (two) times a week. 03/25/21   [provider]  ESTRING 2 MG vaginal ring SMARTSIG:1 Ring Vaginal Every 3 Months 08/27/20   [provider]  fluconazole (DIFLUCAN) 150 MG tablet fluconazole 150 mg tablet    [provider]  fluticasone (FLONASE) 50 MCG/ACT nasal spray Place 1 spray into both nostrils daily.    [provider]  hydrocortisone (ANUSOL-HC) 2.5 % rectal cream SMARTSIG:1 Topical Every Night 05/18/20   [provider]  Hyoscyamine Sulfate SL 0.125 MG SUBL hyoscyamine 0.125 mg sublingual tablet    [provider]  ibuprofen (ADVIL) 600 MG tablet Take 600 mg by mouth 2 (two) times daily as needed. 03/12/19   [provider]  ibuprofen (ADVIL,MOTRIN) 800 MG tablet Take 1 tablet (800 mg total) by mouth every 8 (eight) hours as  needed. 12/03/15   Ena Dawley, MD  [place] 4 MCG INST Place vaginally. 07/26/20   [provider]  INTRAROSA 6.5 MG INST Place 1 suppository vaginally at bedtime. 05/15/19   [provider]  leucovorin (WELLCOVORIN) 5 MG tablet Take 5 mg by mouth once a week. 08/30/20   [provider]  levofloxacin (LEVAQUIN) 500 MG tablet levofloxacin 500 mg tablet    [provider]  meloxicam (MOBIC) 15 MG tablet Take 1 tablet (15 mg total) by mouth daily. 09/10/19   Edrick Kins, DPM  methylPREDNISolone (MEDROL DOSEPAK) 4 MG TBPK tablet 6 day dose pack - take as directed 09/09/20   Edrick Kins, DPM  metroNIDAZOLE (METROCREAM) 0.75 % cream metronidazole 0.75 % topical cream    [provider]  naproxen sodium (ANAPROX) 550 MG tablet naproxen sodium 550 mg tablet    [provider]  NEOMYCIN-POLYMYXIN-HYDROCORTISONE (CORTISPORIN) 1 % SOLN OTIC solution neomycin-polymyxin-hydrocort 3.5 mg/mL-10,000 unit/mL-1 % ear solution    [provider]  phenazopyridine (PYRIDIUM) 200 MG tablet phenazopyridine 200 mg tablet    [provider]  predniSONE (DELTASONE) 5 MG tablet prednisone 5 mg tablet  TAKE TWO TABLETS BY MOUTH DAILY FOR 7 DAYS. THEN TAKE ONE TABLET BY MOUTH DAILY FOR 7 DAYS. TAKE WITH FOOD OR MILK    [provider]  SIMPONI ARIA 50 MG/4ML SOLN injection Inject into the vein. 08/03/20   [provider]  spironolactone (ALDACTONE) 50 MG tablet 1 tablet    [provider]  tiZANidine (ZANAFLEX) 2 MG tablet Take 2 mg by mouth 3 (three) times daily as needed. 02/21/19   [provider]  valACYclovir (VALTREX) 500 MG tablet SMARTSIG:1 Caplet By Mouth Daily 05/03/19   [provider]  Merril Abbe 10 MCG TABS vaginal tablet  06/17/19   [provider]    Family History Family History  Problem Relation Age of Onset   Cancer Paternal Grandmother        stomach    Diabetes  Father    Breast cancer Mother    Hypertension Mother    Scoliosis Daughter     Social History Social History   Tobacco Use   Smoking status: Never   Smokeless tobacco: Never  Vaping Use   Vaping Use: Never used  Substance Use Topics   Alcohol use: Yes    Alcohol/week: 2.0 standard drinks    Types: 2 Glasses of wine per week   Drug use: No     Allergies   Doxycycline hyclate, Percocet [oxycodone-acetaminophen], and Tylenol [acetaminophen]   Review of Systems Review of Systems  Constitutional:  Negative for chills and fever.  HENT:  Positive for congestion, postnasal drip and sore throat. Negative for ear pain.   Respiratory:  Positive for cough. Negative for shortness of breath.   Cardiovascular:  Negative for chest pain and palpitations.  Gastrointestinal:  Negative for diarrhea and vomiting.  Skin:  Negative for color change and rash.  Neurological:  Positive for headaches. Negative for dizziness.  All other systems reviewed and are negative.   Physical Exam Triage Vital Signs ED Triage Vitals  Enc Vitals Group     BP      Pulse      Resp      Temp      Temp src      SpO2      Weight      Height      Head Circumference      Peak Flow      Pain Score      Pain Loc      Pain Edu?      Excl. in [place]?    No data found.  Updated Vital Signs BP 117/82 (BP Location: Left Arm)   Pulse 95   Temp 98.8 F (37.1 C) (Oral)   Resp 18   LMP 06/24/2012   SpO2 98%   Visual Acuity Right Eye Distance:   Left Eye Distance:   Bilateral Distance:    Right Eye Near:   Left Eye Near:    Bilateral Near:     Physical Exam Vitals and nursing note reviewed.  Constitutional:      General: She is not in acute distress.    Appearance: She is well-developed. She is not ill-appearing.  HENT:     Head: Normocephalic and atraumatic.     Right Ear: Tympanic membrane normal.     Left Ear: Tympanic membrane normal.     Nose: Congestion present.     Mouth/Throat:      Mouth: Mucous membranes are moist.     Pharynx: Oropharynx is clear.  Cardiovascular:     Rate and Rhythm: Normal rate and regular rhythm.     Heart sounds: Normal heart sounds.  Pulmonary:     Effort: Pulmonary effort is normal. No respiratory distress.     Breath sounds: Normal breath sounds.  Abdominal:     Palpations: Abdomen is soft.     Tenderness: There is no abdominal tenderness.  Musculoskeletal:     Cervical back: Neck supple.  Skin:    General: Skin is warm and dry.  Neurological:     Mental Status: She is alert.  Psychiatric:        Mood and Affect: Mood normal.        Behavior: Behavior normal.     UC Treatments / Results  Labs (all labs ordered are listed, but only abnormal results are displayed) Labs Reviewed  COVID-19, FLU A+B NAA    EKG   Radiology No results found.  Procedures Procedures (including critical care time)  Medications Ordered in UC Medications - No data to display  Initial Impression / Assessment and Plan / UC Course  I have reviewed the triage vital signs and the nursing notes.  Pertinent labs & imaging results that were available during my care of the patient were reviewed by me and considered in my medical decision making (see chart for details).   Viral illness.  COVID and Flu pending.  Instructed patient to self quarantine per CDC guidelines.  Treating cough  with [employer]  Discussed symptomatic treatment including Tylenol or ibuprofen, rest, hydration.  Instructed patient to follow up with PCP if symptoms are not improving.  Patient agrees to plan of care.    Final Clinical Impressions(s) / UC Diagnoses   Final diagnoses:  Viral illness     Discharge Instructions      Your COVID and Flu tests are pending.  You should self quarantine until the test results are back.    Take the [facility] as needed for cough.  Take Tylenol or ibuprofen as needed for fever or discomfort.  Rest and keep yourself hydrated.     Follow-up with your primary care provider if your symptoms are not improving.       ED Prescriptions     Medication Sig Dispense Auth. Provider   benzonatate (TESSALON) 100 MG capsule Take 1 capsule (100 mg total) by mouth 3 (three) times daily as needed for cough. 21 capsule Sharion Balloon, NP      PDMP not reviewed this encounter.   Sharion Balloon, NP 04/19/21 [phone]

## 2021-04-20 LAB — COVID-19, FLU A+B NAA
Influenza A, NAA: NOT DETECTED
Influenza B, NAA: NOT DETECTED
SARS-CoV-2, NAA: NOT DETECTED

## 2021-05-26 DIAGNOSIS — R053 Chronic cough: Secondary | ICD-10-CM | POA: Diagnosis not present

## 2021-05-26 DIAGNOSIS — M0609 Rheumatoid arthritis without rheumatoid factor, multiple sites: Secondary | ICD-10-CM | POA: Diagnosis not present

## 2021-06-12 DIAGNOSIS — G4733 Obstructive sleep apnea (adult) (pediatric): Secondary | ICD-10-CM | POA: Diagnosis not present

## 2021-06-19 NOTE — Progress Notes (Signed)
Synopsis: Referred for chronic cough by Merrilee Seashore, MD  Subjective:   PATIENT ID: Loretta Sweeney GENDER: female DOB: 1970/06/02, MRN: [id number]  Chief Complaint  Patient presents with   Pulmonary Consult    Referred by Dr. Merrilee Seashore. Pt has had cough since she was dx with Covid 10 Jun 2019.  She states she has been having some SOB since Dec 2022- just walking up a flight of stairs gets her winded. She states not able to sing as much as she used to due to cough and SOB. Her cough is mainly non prod. She has cough at night when she lies down but this is better since starting CPAP.    50yF with history of RA sees Dr. Shauna Hugh with [facility] on methotrexate since 2021 (she thinks she's on 20 mg total weekly) and simponi since 2022, OSA on CPAP sees Dr. Ashby Dawes - has apria CPAP absolutely wears more than 4h/night. She says quantiferon was negative prior to simponi.  She says cough started 05/2019 after she had covid-19. She got one of the monoclonal Ab infusions, was not hospitalized. Had viral infection she thinks in 04/2021 with flu and covid-19 testing negative, cough worse after that. Still dry cough. She does have episodic DOE as well over this same time frame. No CP. No orthopnea but she wears CPAP at night. Very frequent postnasal drainage, uses flonase over the summer mostly for it and allegra everyday. She has occasional reflux.    Maybe had steroids in summer of 2022 but she is unsure if this made difference in her cough.  Other medicines tried for RA: Currently on simponi Maybe tried plaquenil in past   Otherwise pertinent review of systems is negative.  No family history of lung disease, autoimmune disease  She is a hair stylist. She has never lived outside of [place]. Never smoker, no vaping. Has a poodle/pekignese.     Past Medical History:  Diagnosis Date   Abnormal Pap smear 11/2011   Bacterial infection    Hx   H/O  varicella    HSV-2 (herpes simplex virus 2) infection    Low iron    Hx   Lower abdominal pain 12/25/2011   SVD (spontaneous vaginal delivery)    x 1   Yeast infection    Hx   Yeast infection      Family History  Problem Relation Age of Onset   Cancer Paternal Grandmother        stomach    Diabetes Father    Breast cancer Mother    Hypertension Mother    Scoliosis Daughter      Past Surgical History:  Procedure Laterality Date   ABDOMINAL HYSTERECTOMY     VAGINAL HYSTERECTOMY N/A 12/02/2015   Procedure: HYSTERECTOMY VAGINAL (B) Salpingectomy;  Surgeon: Ena Dawley, MD;  Location: [facility];  Service: Gynecology;  Laterality: N/A;   WISDOM TOOTH EXTRACTION      Social History   Socioeconomic History   Marital status: Married    Spouse name: Not on file   Number of children: Not on file   Years of education: Not on file   Highest education level: Not on file  Occupational History   Not on file  Tobacco Use   Smoking status: Never   Smokeless tobacco: Never  Vaping Use   Vaping Use: Never used  Substance and Sexual Activity   Alcohol use: Yes    Alcohol/week: 2.0 standard drinks  Types: 2 Glasses of wine per week   Drug use: No   Sexual activity: Yes    Birth control/protection: Pill  Other Topics Concern   Not on file  Social History Narrative   Not on file   Social Determinants of Health   Financial Resource Strain: Not on file  Food Insecurity: Not on file  Transportation Needs: Not on file  Physical Activity: Not on file  Stress: Not on file  Social Connections: Not on file  Intimate Partner Violence: Not on file     Allergies  Allergen Reactions   Doxycycline Hyclate     Other reaction(s): Yeast   Percocet [Oxycodone-Acetaminophen]    Tylenol [Acetaminophen]      Outpatient Medications Prior to Visit  Medication Sig Dispense Refill   acyclovir (ZOVIRAX) 400 MG tablet Take 400 mg by mouth as needed.      estradiol (VIVELLE-DOT) 0.05  MG/24HR patch 1 patch 2 (two) times a week.     fexofenadine (ALLEGRA) 180 MG tablet Take 180 mg by mouth daily.     folic acid (FOLVITE) 1 MG tablet Take 2 mg by mouth daily.     ibuprofen (ADVIL,MOTRIN) 800 MG tablet Take 1 tablet (800 mg total) by mouth every 8 (eight) hours as needed. 50 tablet 1   methotrexate 2.5 MG tablet Take 15 mg by mouth once a week.     SIMPONI ARIA 50 MG/4ML SOLN injection Inject into the vein.     valACYclovir (VALTREX) 500 MG tablet SMARTSIG:1 Caplet By Mouth Daily     doxycycline (VIBRAMYCIN) 50 MG capsule Take 50 mg by mouth daily.     benzonatate (TESSALON) 100 MG capsule Take 1 capsule (100 mg total) by mouth 3 (three) times daily as needed for cough. 21 capsule 0   brompheniramine-pseudoephedrine-DM 30-2-10 MG/5ML syrup Take 5 mLs by mouth 3 (three) times daily as needed.     CLENPIQ 10-3.5-12 MG-GM -GM/160ML SOLN See admin instructions.     clobetasol (OLUX) 0.05 % topical foam clobetasol 0.05 % topical foam     clotrimazole-betamethasone (LOTRISONE) cream clotrimazole-betamethasone 1 %-0.05 % topical cream     diclofenac (VOLTAREN) 75 MG EC tablet Take 1 tablet (75 mg total) by mouth 2 (two) times daily. 60 tablet 1   ESTRING 2 MG vaginal ring SMARTSIG:1 Ring Vaginal Every 3 Months     fluconazole (DIFLUCAN) 150 MG tablet fluconazole 150 mg tablet     fluticasone (FLONASE) 50 MCG/ACT nasal spray Place 1 spray into both nostrils daily.     hydrocortisone (ANUSOL-HC) 2.5 % rectal cream SMARTSIG:1 Topical Every Night     Hyoscyamine Sulfate SL 0.125 MG SUBL hyoscyamine 0.125 mg sublingual tablet     ibuprofen (ADVIL) 600 MG tablet Take 600 mg by mouth 2 (two) times daily as needed.     IMVEXXY STARTER PACK 4 MCG INST Place vaginally.     INTRAROSA 6.5 MG INST Place 1 suppository vaginally at bedtime.     leucovorin (WELLCOVORIN) 5 MG tablet Take 5 mg by mouth once a week.     levofloxacin (LEVAQUIN) 500 MG tablet levofloxacin 500 mg tablet     meloxicam  (MOBIC) 15 MG tablet Take 1 tablet (15 mg total) by mouth daily. 30 tablet 1   methylPREDNISolone (MEDROL DOSEPAK) 4 MG TBPK tablet 6 day dose pack - take as directed 21 tablet 0   metroNIDAZOLE (METROCREAM) 0.75 % cream metronidazole 0.75 % topical cream     naproxen sodium (ANAPROX) 550  MG tablet naproxen sodium 550 mg tablet     NEOMYCIN-POLYMYXIN-HYDROCORTISONE (CORTISPORIN) 1 % SOLN OTIC solution neomycin-polymyxin-hydrocort 3.5 mg/mL-10,000 unit/mL-1 % ear solution     phenazopyridine (PYRIDIUM) 200 MG tablet phenazopyridine 200 mg tablet     predniSONE (DELTASONE) 5 MG tablet prednisone 5 mg tablet  TAKE TWO TABLETS BY MOUTH DAILY FOR 7 DAYS. THEN TAKE ONE TABLET BY MOUTH DAILY FOR 7 DAYS. TAKE WITH FOOD OR MILK     spironolactone (ALDACTONE) 50 MG tablet 1 tablet     tiZANidine (ZANAFLEX) 2 MG tablet Take 2 mg by mouth 3 (three) times daily as needed.     YUVAFEM 10 MCG TABS vaginal tablet      No facility-administered medications prior to visit.       Objective:   Physical Exam:  General appearance: 51 y.o., female, NAD, conversant  Eyes: anicteric sclerae; PERRL, tracking appropriately HENT: NCAT; MMM Neck: Trachea midline; no lymphadenopathy, no JVD Lungs: CTAB, no crackles, no wheeze, with normal respiratory effort CV: RRR, no murmur  Abdomen: Soft, non-tender; non-distended, BS present  Extremities: No peripheral edema, warm Skin: Normal turgor and texture; no rash Psych: Appropriate affect Neuro: Alert and oriented to person and place, no focal deficit     Vitals:   06/20/21 1545  BP: 112/66  Pulse: 87  Temp: 97.8 F (36.6 C)  TempSrc: Oral  SpO2: 98%  Weight: 149 lb 9.6 oz (67.9 kg)  Height: 5\' 4"  (1.626 m)   98% on RA BMI Readings from Last 3 Encounters:  06/20/21 25.68 kg/m  05/29/19 25.75 kg/m  11/24/15 24.20 kg/m   Wt Readings from Last 3 Encounters:  06/20/21 149 lb 9.6 oz (67.9 kg)  05/29/19 150 lb (68 kg)  11/24/15 141 lb (64 kg)      CBC    Component Value Date/Time   WBC 13.2 (H) 12/02/2015 1336   RBC 4.06 12/02/2015 1336   HGB 11.9 (L) 12/02/2015 1336   HCT 35.6 (L) 12/02/2015 1336   PLT 310 12/02/2015 1336   MCV 87.7 12/02/2015 1336   MCH 29.3 12/02/2015 1336   MCHC 33.4 12/02/2015 1336   RDW 13.3 12/02/2015 1336    Chest Imaging: None available for review  Pulmonary Functions Testing Results: No flowsheet data found.     Assessment & Plan:   # Chronic cough # DOE Could be covid-19 related issue whether simply long hauler, small airways disease, or ILD. Possibility as well of either RA related or MTX related ILD. PND does sound active and like it could be contributory to cough regardless of underlying cause.   Plan: - PFTs if diffusing capacity reduced then we will get HRCT Chest - trial of flonase 1 spray each nare after either neti pot or shower and clearing nose of crusting  RC 2-4 weeks with me and PFT  I spent 46 minutes dedicated to the care of this patient on the date of this encounter to include pre-visit review of records, face-to-face time with the patient discussing conditions above, post visit ordering of testing, clinical documentation with the electronic health record, making appropriate referrals as documented, and communicating necessary findings to members of the patients care team.     Maryjane Hurter, MD [facility] 06/20/2021 4:03 PM

## 2021-06-20 ENCOUNTER — Ambulatory Visit (INDEPENDENT_AMBULATORY_CARE_PROVIDER_SITE_OTHER): Payer: Self-pay | Admitting: Student

## 2021-06-20 ENCOUNTER — Other Ambulatory Visit: Payer: Self-pay

## 2021-06-20 ENCOUNTER — Encounter: Payer: Self-pay | Admitting: Student

## 2021-06-20 VITALS — BP 112/66 | HR 87 | Temp 97.8°F | Ht 64.0 in | Wt 149.6 lb

## 2021-06-20 DIAGNOSIS — Z9989 Dependence on other enabling machines and devices: Secondary | ICD-10-CM

## 2021-06-20 DIAGNOSIS — G4733 Obstructive sleep apnea (adult) (pediatric): Secondary | ICD-10-CM

## 2021-06-20 DIAGNOSIS — R053 Chronic cough: Secondary | ICD-10-CM

## 2021-06-20 DIAGNOSIS — R0609 Other forms of dyspnea: Secondary | ICD-10-CM

## 2021-06-20 NOTE — Patient Instructions (Addendum)
-   flonase 1 spray each nare after shower or neti pot clear your nose out and then use your flonase tilted just away from nasal septum - PFT in 2-4 weeks and clinic appointment with me to discuss

## 2021-06-24 DIAGNOSIS — M0609 Rheumatoid arthritis without rheumatoid factor, multiple sites: Secondary | ICD-10-CM | POA: Diagnosis not present

## 2021-06-28 DIAGNOSIS — G4733 Obstructive sleep apnea (adult) (pediatric): Secondary | ICD-10-CM | POA: Diagnosis not present

## 2021-06-28 DIAGNOSIS — G4719 Other hypersomnia: Secondary | ICD-10-CM | POA: Diagnosis not present

## 2021-07-04 ENCOUNTER — Ambulatory Visit (INDEPENDENT_AMBULATORY_CARE_PROVIDER_SITE_OTHER): Payer: BC Managed Care – PPO

## 2021-07-04 ENCOUNTER — Other Ambulatory Visit: Payer: Self-pay

## 2021-07-04 ENCOUNTER — Ambulatory Visit (INDEPENDENT_AMBULATORY_CARE_PROVIDER_SITE_OTHER): Payer: BC Managed Care – PPO | Admitting: Podiatry

## 2021-07-04 DIAGNOSIS — M722 Plantar fascial fibromatosis: Secondary | ICD-10-CM

## 2021-07-04 MED ORDER — METHYLPREDNISOLONE 4 MG PO TBPK: ORAL_TABLET | ORAL | 0 refills | Status: DC

## 2021-07-04 NOTE — Progress Notes (Signed)
° °  Subjective: 51 y.o. female presenting today for evaluation of left heel pain this been going on for about 2 weeks.  She cannot recall an incident or injury that would have elicited the pain.  She says that she woke up in the morning with pain and tenderness associated to the plantar heel.  She presents for further treatment and evaluation   Past Medical History:  Diagnosis Date   Abnormal Pap smear 11/2011   Bacterial infection    Hx   H/O varicella    HSV-2 (herpes simplex virus 2) infection    Low iron    Hx   Lower abdominal pain 12/25/2011   SVD (spontaneous vaginal delivery)    x 1   Yeast infection    Hx   Yeast infection      Objective: Physical Exam General: The patient is alert and oriented x3 in no acute distress.  Dermatology: Skin is warm, dry and supple bilateral lower extremities. Negative for open lesions or macerations bilateral.   Vascular: Dorsalis Pedis and Posterior Tibial pulses palpable bilateral.  Capillary fill time is immediate to all digits.  Neurological: Epicritic and protective threshold intact bilateral.   Musculoskeletal: Tenderness to palpation to the plantar aspect of the left heel along the plantar fascia. All other joints range of motion within normal limits bilateral. Strength 5/5 in all groups bilateral.   Radiographic exam: Normal osseous mineralization. Joint spaces preserved. No fracture/dislocation/boney destruction. No other soft tissue abnormalities or radiopaque foreign bodies.  Posterior heel spur noted which is asymptomatic clinically  Assessment: 1. Plantar fasciitis left foot 2.  Rheumatoid arthritis  Plan of Care:  1. Patient evaluated. Xrays reviewed.   2.  Patient declined injection today 3. Rx for Medrol Dose Pak placed 4.  Patient is on methotrexate weekly secondary to RA.  No NSAIDs prescribed 5. Plantar fascial band(s) dispensed  6. Instructed patient regarding therapies and modalities at home to alleviate symptoms.   7. Return to clinic as needed  *Hairstylist   Edrick Kins, DPM [facility]  Dr. Edrick Kins, DPM    [address], [place] [address]                Office [phone]  Fax [phone]

## 2021-07-11 DIAGNOSIS — M0609 Rheumatoid arthritis without rheumatoid factor, multiple sites: Secondary | ICD-10-CM | POA: Diagnosis not present

## 2021-07-11 DIAGNOSIS — M1990 Unspecified osteoarthritis, unspecified site: Secondary | ICD-10-CM | POA: Diagnosis not present

## 2021-07-11 DIAGNOSIS — Z79899 Other long term (current) drug therapy: Secondary | ICD-10-CM | POA: Diagnosis not present

## 2021-07-11 DIAGNOSIS — R768 Other specified abnormal immunological findings in serum: Secondary | ICD-10-CM | POA: Diagnosis not present

## 2021-07-13 DIAGNOSIS — G4733 Obstructive sleep apnea (adult) (pediatric): Secondary | ICD-10-CM | POA: Diagnosis not present

## 2021-07-21 ENCOUNTER — Ambulatory Visit (INDEPENDENT_AMBULATORY_CARE_PROVIDER_SITE_OTHER): Payer: BC Managed Care – PPO | Admitting: Student

## 2021-07-21 ENCOUNTER — Other Ambulatory Visit: Payer: Self-pay

## 2021-07-21 ENCOUNTER — Encounter: Payer: Self-pay | Admitting: Student

## 2021-07-21 VITALS — BP 124/76 | HR 72 | Temp 98.7°F | Ht 64.0 in | Wt 151.0 lb

## 2021-07-21 DIAGNOSIS — K219 Gastro-esophageal reflux disease without esophagitis: Secondary | ICD-10-CM | POA: Diagnosis not present

## 2021-07-21 DIAGNOSIS — R053 Chronic cough: Secondary | ICD-10-CM

## 2021-07-21 LAB — PULMONARY FUNCTION TEST
DL/VA % pred: 111 %
DL/VA: 4.79 ml/min/mmHg/L
DLCO cor % pred: 106 %
DLCO cor: 22.35 ml/min/mmHg
DLCO unc % pred: 106 %
DLCO unc: 22.35 ml/min/mmHg
FEF 25-75 Post: 1.01 L/sec
FEF 25-75 Pre: 2.3 L/sec
FEF2575-%Change-Post: -56 %
FEF2575-%Pred-Post: 41 %
FEF2575-%Pred-Pre: 95 %
FEV1-%Change-Post: -24 %
FEV1-%Pred-Post: 86 %
FEV1-%Pred-Pre: 114 %
FEV1-Post: 2 L
FEV1-Pre: 2.64 L
FEV1FVC-%Change-Post: -20 %
FEV1FVC-%Pred-Pre: 90 %
FEV6-%Change-Post: -4 %
FEV6-%Pred-Post: 123 %
FEV6-%Pred-Pre: 128 %
FEV6-Post: 3.45 L
FEV6-Pre: 3.6 L
FEV6FVC-%Pred-Post: 102 %
FEV6FVC-%Pred-Pre: 102 %
FVC-%Change-Post: -4 %
FVC-%Pred-Post: 119 %
FVC-%Pred-Pre: 125 %
FVC-Post: 3.45 L
FVC-Pre: 3.6 L
Post FEV1/FVC ratio: 58 %
Post FEV6/FVC ratio: 100 %
Pre FEV1/FVC ratio: 73 %
Pre FEV6/FVC Ratio: 100 %
RV % pred: 103 %
RV: 1.87 L
TLC % pred: 101 %
TLC: 5.12 L

## 2021-07-21 MED ORDER — PANTOPRAZOLE SODIUM 40 MG PO TBEC: 40.0000 mg | DELAYED_RELEASE_TABLET | Freq: Every day | ORAL | 0 refills | Status: DC

## 2021-07-21 NOTE — Patient Instructions (Signed)
Full PFT performed today. °

## 2021-07-21 NOTE — Progress Notes (Signed)
Full PFT performed today. °

## 2021-07-21 NOTE — Patient Instructions (Signed)
-   See you in 8 weeks ?-  Take pantoprazole 40 mg daily 30 minutes before either breakfast or dinner until next visit ?

## 2021-07-21 NOTE — Progress Notes (Signed)
? ?Synopsis: Referred for chronic cough by Merrilee Seashore, MD ? ?Subjective:  ? ?PATIENT ID: Loretta Sweeney GENDER: female DOB: 1971/05/22, MRN: [id number] ? ?Chief Complaint  ?Patient presents with  ? Follow-up  ?  PFT's done today. Her cough is unchanged. She feels like she is not able to get enough air when she sings.   ? ?51F with history of RA sees Dr. Shauna Hugh with [facility] on methotrexate since 2021 (she thinks she's on 20 mg total weekly) and simponi since 2022, OSA on CPAP sees Dr. Ashby Dawes - has apria CPAP absolutely wears more than 4h/night. She says quantiferon was negative prior to simponi. ? ?She says cough started 05/2019 after she had covid-19. She got one of the monoclonal Ab infusions, was not hospitalized. Had viral infection she thinks in 04/2021 with flu and covid-19 testing negative, cough worse after that. Still dry cough. She does have episodic DOE as well over this same time frame. No CP. No orthopnea but she wears CPAP at night. Very frequent postnasal drainage, uses flonase over the summer mostly for it and allegra everyday. She has occasional reflux.   ? ?Maybe had steroids in summer of 2022 but she is unsure if this made difference in her cough. ? ?Other medicines tried for RA: ?Currently on simponi ?Maybe tried plaquenil in past ? ?No family history of lung disease, autoimmune disease ? ?She is a hair stylist. She has never lived outside of [place]. Never smoker, no vaping. Has a poodle/pekignese.  ? ?Interval HPI: ?She says cough is essentially unchanged. Flonase hasn't helped with cough. Dry cough mostly, every now and then productive. Similar episodic DOE that's relatively mild. She notices that she has trouble hitting some notes and holding them when she is singing.  ? ? ?Otherwise pertinent review of systems is negative. ? ? ? ?Past Medical History:  ?Diagnosis Date  ? Abnormal Pap smear 11/2011  ? Bacterial infection   ? Hx  ? H/O varicella   ?  HSV-2 (herpes simplex virus 2) infection   ? Low iron   ? Hx  ? Lower abdominal pain 12/25/2011  ? SVD (spontaneous vaginal delivery)   ? x 1  ? Yeast infection   ? Hx  ? Yeast infection   ?  ? ?Family History  ?Problem Relation Age of Onset  ? Cancer Paternal Grandmother   ?     stomach   ? Diabetes Father   ? Breast cancer Mother   ? Hypertension Mother   ? Scoliosis Daughter   ?  ? ?Past Surgical History:  ?Procedure Laterality Date  ? ABDOMINAL HYSTERECTOMY    ? VAGINAL HYSTERECTOMY N/A 12/02/2015  ? Procedure: HYSTERECTOMY VAGINAL (B) Salpingectomy;  Surgeon: Ena Dawley, MD;  Location: [facility];  Service: Gynecology;  Laterality: N/A;  ? WISDOM TOOTH EXTRACTION    ? ? ?Social History  ? ?Socioeconomic History  ? Marital status: Married  ?  Spouse name: Not on file  ? Number of children: Not on file  ? Years of education: Not on file  ? Highest education level: Not on file  ?Occupational History  ? Not on file  ?Tobacco Use  ? Smoking status: Never  ? Smokeless tobacco: Never  ?Vaping Use  ? Vaping Use: Never used  ?Substance and Sexual Activity  ? Alcohol use: Yes  ?  Alcohol/week: 2.0 standard drinks  ?  Types: 2 Glasses of wine per week  ? Drug use: No  ?  Sexual activity: Yes  ?  Birth control/protection: Pill  ?Other Topics Concern  ? Not on file  ?Social History Narrative  ? Not on file  ? ?Social Determinants of Health  ? ?Financial Resource Strain: Not on file  ?Food Insecurity: Not on file  ?Transportation Needs: Not on file  ?Physical Activity: Not on file  ?Stress: Not on file  ?Social Connections: Not on file  ?Intimate Partner Violence: Not on file  ?  ? ?Allergies  ?Allergen Reactions  ? Doxycycline Hyclate   ?  Other reaction(s): Yeast  ? Percocet [Oxycodone-Acetaminophen]   ? Tylenol [Acetaminophen]   ?  ? ?Outpatient Medications Prior to Visit  ?Medication Sig Dispense Refill  ? estradiol (VIVELLE-DOT) 0.05 MG/24HR patch 1 patch 2 (two) times a week.    ? fexofenadine (ALLEGRA) 180 MG tablet Take  180 mg by mouth daily.    ? folic acid (FOLVITE) 1 MG tablet Take 2 mg by mouth daily.    ? ibuprofen (ADVIL,MOTRIN) 800 MG tablet Take 1 tablet (800 mg total) by mouth every 8 (eight) hours as needed. 50 tablet 1  ? methotrexate 2.5 MG tablet Take 15 mg by mouth once a week.    ? methylPREDNISolone (MEDROL DOSEPAK) 4 MG TBPK tablet 6 day dose pack - take as directed 21 tablet 0  ? SIMPONI ARIA 50 MG/4ML SOLN injection Inject into the vein.    ? valACYclovir (VALTREX) 500 MG tablet SMARTSIG:1 Caplet By Mouth Daily    ? acyclovir (ZOVIRAX) 400 MG tablet Take 400 mg by mouth as needed.     ? doxycycline (VIBRAMYCIN) 50 MG capsule Take 50 mg by mouth daily.    ? ?No facility-administered medications prior to visit.  ? ? ? ? ? ?Objective:  ? ?Physical Exam: ? ?General appearance: 51 y.o., female, NAD, conversant, female, NAD, conversant  ?Eyes: anicteric sclerae; PERRL, tracking appropriately ?HENT: NCAT; MMM ?Neck: Trachea midline; no lymphadenopathy, no JVD ?Lungs: CTAB, no crackles, no wheeze, with normal respiratory effort ?CV: RRR, no murmur  ?Abdomen: Soft, non-tender; non-distended, BS present  ?Extremities: No peripheral edema, warm ?Skin: Normal turgor and texture; no rash ?Psych: Appropriate affect ?Neuro: Alert and oriented to person and place, no focal deficit  ? ? ? ?Vitals:  ? 07/21/21 1612  ?BP: 124/76  ?Pulse: 72  ?Temp: 98.7 ?F (37.1 ?C)  ?TempSrc: Oral  ?SpO2: 99%  ?Weight: 151 lb (68.5 kg)  ?Height: 5\' 4"  (1.626 m)  ? ?99% on RA ?BMI Readings from Last 3 Encounters:  ?07/21/21 25.92 kg/m?  ?06/20/21 25.68 kg/m?  ?05/29/19 25.75 kg/m?  ? ?Wt Readings from Last 3 Encounters:  ?07/21/21 151 lb (68.5 kg)  ?06/20/21 149 lb 9.6 oz (67.9 kg)  ?05/29/19 150 lb (68 kg)  ? ? ? ?CBC ?   ?Component Value Date/Time  ? WBC 13.2 (H) 12/02/2015 1336  ? RBC 4.06 12/02/2015 1336  ? HGB 11.9 (L) 12/02/2015 1336  ? HCT 35.6 (L) 12/02/2015 1336  ? PLT 310 12/02/2015 1336  ? MCV 87.7 12/02/2015 1336  ? MCH 29.3 12/02/2015 1336  ? MCHC 33.4  12/02/2015 1336  ? RDW 13.3 12/02/2015 1336  ? ? ?Chest Imaging: ?None available for review ? ?Pulmonary Functions Testing Results: ?PFT Results Latest Ref Rng & Units 07/21/2021  ?FVC-Pre L 3.60  ?FVC-Predicted Pre % 125  ?FVC-Post L 3.45  ?FVC-Predicted Post % 119  ?Pre FEV1/FVC % % 73  ?Post FEV1/FCV % % 58  ?FEV1-Pre L 2.64  ?FEV1-Predicted Pre % 114  ?  FEV1-Post L 2.00  ?DLCO uncorrected ml/min/mmHg 22.35  ?DLCO UNC% % 106  ?DLCO corrected ml/min/mmHg 22.35  ?DLCO COR %Predicted % 106  ?DLVA Predicted % 111  ?TLC L 5.12  ?TLC % Predicted % 101  ?RV % Predicted % 103  ? ? ?   ?Assessment & Plan:  ? ?# Chronic cough ?# Episodic DOE ?PFTs not suggestive of ILD. No features of asthma on pre/post BD spirometry. Hasn't improved a ton with treatment of PND. Today more concerned that GERD/LPR and/or VCD may be playing role..  ? ?Plan: ?- protonix 40 mg daily 30 min before breakfast or dinner for 8 weeks ?- continue flonase 1 spray each nare after either neti pot or shower and clearing nose of crusting ?- claritin or zyrtec daily during allergy season ? ? ?RC 8 weeks consider referral to ENT/speech vs methacholine challenge ? ? ? ?Maryjane Hurter, MD ?[facility] ?07/21/2021 4:40 PM  ? ?

## 2021-07-22 ENCOUNTER — Other Ambulatory Visit (HOSPITAL_COMMUNITY): Payer: Self-pay

## 2021-07-22 ENCOUNTER — Telehealth: Payer: Self-pay | Admitting: Pharmacy Technician

## 2021-07-22 NOTE — Telephone Encounter (Signed)
Patient Advocate Encounter ? ?Received notification from [facility]) that prior authorization for PANTOPRAZOLE 40MG  is required. ?  ?PA submitted on 3.3.23 ?Key [id number] ?Status is pending ?  ?[facility] will continue to follow ? ?Loretta Sweeney, CPhT ?Patient Advocate ?Phone: [phone] ?Fax:  [phone] ? ?

## 2021-07-28 NOTE — Telephone Encounter (Signed)
Patient Advocate Encounter ? ?Received notification from [facility] that the request for prior authorization for Pantoprazole '40mg'$  tabs has been denied due to the patient not trying the preferred meds.: Omeprazole, Lansoprazole or Esomeprazole. ?  ? ?Specialty Pharmacy Patient Advocate ?Fax: [phone]  ?

## 2021-07-28 NOTE — Telephone Encounter (Signed)
Dr. Verlee Monte, pantoprazole 40 mg qd not covered and PA was denied. ?Insurance requires her to try Omeprazole, Lansoprazole or Esomeprazole. ?Please advise what you want to prescribe for her, thanks! ?

## 2021-07-29 ENCOUNTER — Telehealth: Payer: Self-pay

## 2021-07-29 MED ORDER — ESOMEPRAZOLE MAGNESIUM 40 MG PO CPDR: 40.0000 mg | DELAYED_RELEASE_CAPSULE | Freq: Every day | ORAL | 0 refills | Status: AC

## 2021-07-29 NOTE — Addendum Note (Signed)
Addended by: Maryjane Hurter on: 07/29/2021 11:57 AM ? ? Modules accepted: Orders ? ?

## 2021-07-29 NOTE — Telephone Encounter (Signed)
error 

## 2021-08-10 DIAGNOSIS — G4733 Obstructive sleep apnea (adult) (pediatric): Secondary | ICD-10-CM | POA: Diagnosis not present

## 2021-08-22 DIAGNOSIS — M0609 Rheumatoid arthritis without rheumatoid factor, multiple sites: Secondary | ICD-10-CM | POA: Diagnosis not present

## 2021-09-10 DIAGNOSIS — G4733 Obstructive sleep apnea (adult) (pediatric): Secondary | ICD-10-CM | POA: Diagnosis not present

## 2021-09-19 DIAGNOSIS — Z0000 Encounter for general adult medical examination without abnormal findings: Secondary | ICD-10-CM | POA: Diagnosis not present

## 2021-09-26 DIAGNOSIS — M0609 Rheumatoid arthritis without rheumatoid factor, multiple sites: Secondary | ICD-10-CM | POA: Diagnosis not present

## 2021-09-26 DIAGNOSIS — M1990 Unspecified osteoarthritis, unspecified site: Secondary | ICD-10-CM | POA: Diagnosis not present

## 2021-09-26 DIAGNOSIS — Z0000 Encounter for general adult medical examination without abnormal findings: Secondary | ICD-10-CM | POA: Diagnosis not present

## 2021-09-27 ENCOUNTER — Ambulatory Visit: Payer: BC Managed Care – PPO | Admitting: Student

## 2021-10-10 DIAGNOSIS — G4733 Obstructive sleep apnea (adult) (pediatric): Secondary | ICD-10-CM | POA: Diagnosis not present

## 2021-10-18 DIAGNOSIS — M0609 Rheumatoid arthritis without rheumatoid factor, multiple sites: Secondary | ICD-10-CM | POA: Diagnosis not present

## 2021-11-07 DIAGNOSIS — R768 Other specified abnormal immunological findings in serum: Secondary | ICD-10-CM | POA: Diagnosis not present

## 2021-11-07 DIAGNOSIS — Z79899 Other long term (current) drug therapy: Secondary | ICD-10-CM | POA: Diagnosis not present

## 2021-11-07 DIAGNOSIS — M0609 Rheumatoid arthritis without rheumatoid factor, multiple sites: Secondary | ICD-10-CM | POA: Diagnosis not present

## 2021-11-07 DIAGNOSIS — M1990 Unspecified osteoarthritis, unspecified site: Secondary | ICD-10-CM | POA: Diagnosis not present

## 2021-11-10 DIAGNOSIS — G4733 Obstructive sleep apnea (adult) (pediatric): Secondary | ICD-10-CM | POA: Diagnosis not present

## 2021-11-14 DIAGNOSIS — Z23 Encounter for immunization: Secondary | ICD-10-CM | POA: Diagnosis not present

## 2021-12-13 DIAGNOSIS — M0609 Rheumatoid arthritis without rheumatoid factor, multiple sites: Secondary | ICD-10-CM | POA: Diagnosis not present

## 2022-01-09 DIAGNOSIS — L519 Erythema multiforme, unspecified: Secondary | ICD-10-CM | POA: Diagnosis not present

## 2022-01-09 DIAGNOSIS — R21 Rash and other nonspecific skin eruption: Secondary | ICD-10-CM | POA: Diagnosis not present

## 2022-01-11 DIAGNOSIS — L308 Other specified dermatitis: Secondary | ICD-10-CM | POA: Diagnosis not present

## 2022-02-13 DIAGNOSIS — M0609 Rheumatoid arthritis without rheumatoid factor, multiple sites: Secondary | ICD-10-CM | POA: Diagnosis not present

## 2022-02-20 DIAGNOSIS — Z1231 Encounter for screening mammogram for malignant neoplasm of breast: Secondary | ICD-10-CM | POA: Diagnosis not present

## 2022-02-27 DIAGNOSIS — N3941 Urge incontinence: Secondary | ICD-10-CM | POA: Diagnosis not present

## 2022-02-27 DIAGNOSIS — N951 Menopausal and female climacteric states: Secondary | ICD-10-CM | POA: Diagnosis not present

## 2022-02-27 DIAGNOSIS — Z01419 Encounter for gynecological examination (general) (routine) without abnormal findings: Secondary | ICD-10-CM | POA: Diagnosis not present

## 2022-02-27 DIAGNOSIS — Z90710 Acquired absence of both cervix and uterus: Secondary | ICD-10-CM | POA: Diagnosis not present

## 2022-03-13 DIAGNOSIS — M0609 Rheumatoid arthritis without rheumatoid factor, multiple sites: Secondary | ICD-10-CM | POA: Diagnosis not present

## 2022-03-13 DIAGNOSIS — M1990 Unspecified osteoarthritis, unspecified site: Secondary | ICD-10-CM | POA: Diagnosis not present

## 2022-03-13 DIAGNOSIS — Z79899 Other long term (current) drug therapy: Secondary | ICD-10-CM | POA: Diagnosis not present

## 2022-03-13 DIAGNOSIS — R768 Other specified abnormal immunological findings in serum: Secondary | ICD-10-CM | POA: Diagnosis not present

## 2022-04-17 DIAGNOSIS — M0609 Rheumatoid arthritis without rheumatoid factor, multiple sites: Secondary | ICD-10-CM | POA: Diagnosis not present

## 2022-06-07 DIAGNOSIS — L309 Dermatitis, unspecified: Secondary | ICD-10-CM | POA: Diagnosis not present

## 2022-06-12 DIAGNOSIS — M0609 Rheumatoid arthritis without rheumatoid factor, multiple sites: Secondary | ICD-10-CM | POA: Diagnosis not present

## 2022-06-26 DIAGNOSIS — G4733 Obstructive sleep apnea (adult) (pediatric): Secondary | ICD-10-CM | POA: Diagnosis not present

## 2022-06-26 DIAGNOSIS — G4719 Other hypersomnia: Secondary | ICD-10-CM | POA: Diagnosis not present

## 2022-06-28 DIAGNOSIS — Z79899 Other long term (current) drug therapy: Secondary | ICD-10-CM | POA: Diagnosis not present

## 2022-06-28 DIAGNOSIS — M1990 Unspecified osteoarthritis, unspecified site: Secondary | ICD-10-CM | POA: Diagnosis not present

## 2022-06-28 DIAGNOSIS — M0609 Rheumatoid arthritis without rheumatoid factor, multiple sites: Secondary | ICD-10-CM | POA: Diagnosis not present

## 2022-06-28 DIAGNOSIS — R768 Other specified abnormal immunological findings in serum: Secondary | ICD-10-CM | POA: Diagnosis not present

## 2022-07-06 ENCOUNTER — Ambulatory Visit
Admission: EM | Admit: 2022-07-06 | Discharge: 2022-07-06 | Disposition: A | Discharge status: Home / Self Care | Payer: BC Managed Care – PPO | Attending: Emergency Medicine | Admitting: Emergency Medicine

## 2022-07-06 DIAGNOSIS — R0982 Postnasal drip: Secondary | ICD-10-CM | POA: Diagnosis not present

## 2022-07-06 DIAGNOSIS — J029 Acute pharyngitis, unspecified: Secondary | ICD-10-CM | POA: Diagnosis not present

## 2022-07-06 DIAGNOSIS — U071 COVID-19: Secondary | ICD-10-CM | POA: Insufficient documentation

## 2022-07-06 LAB — SARS CORONAVIRUS 2 (TAT 6-24 HRS): SARS Coronavirus 2: POSITIVE — AB

## 2022-07-06 LAB — POCT RAPID STREP A (OFFICE): Rapid Strep A Screen: NEGATIVE

## 2022-07-06 NOTE — ED Provider Notes (Signed)
[facility] Palau    CSN: [id number] Arrival date & time: 07/06/22  1146      History   Chief Complaint Chief Complaint  Patient presents with   Sore Throat    HPI Loretta Sweeney is a 52 y.o. female.  Patient presents with 1 day history of sore throat and postnasal drip.  She reports chronic cough since having COVID in 2021 but is worse x 3 days.  No fever, rash, shortness of breath, vomiting, diarrhea, or other symptoms.  Treatment at home with OTC cold/sinus medication.  Her medical history includes Rheumatoid arthritis.    The history is provided by the patient and medical records.    Past Medical History:  Diagnosis Date   Abnormal Pap smear 11/2011   Bacterial infection    Hx   H/O varicella    HSV-2 (herpes simplex virus 2) infection    Low iron    Hx   Lower abdominal pain 12/25/2011   SVD (spontaneous vaginal delivery)    x 1   Yeast infection    Hx   Yeast infection     Patient Active Problem List   Diagnosis Date Noted   BMI 25.0-25.9,adult 05/31/2019   Rheumatoid arthritis ([place]) 05/31/2019   Lab test positive for detection of COVID-19 virus 05/31/2019   Dysmenorrhea 12/02/2015   Dyspareunia 07/15/2012   Dysplasia of cervix, low grade (CIN 1) 01/11/2012   ASCUS on Pap smear 12/26/2011   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 Authorizing Provider   predniSONE (DELTASONE) 5 MG tablet TAKE TWO TABLETS BY MOUTH DAILY FOR 7 DAYS. THEN TAKE ONE TABLET BY MOUTH DAILY FOR 7 DAYS. TAKE WITH FOOD OR MILK 06/28/22  Yes [provider]  esomeprazole (NEXIUM) 40 MG capsule Take 1 capsule (40 mg total) by mouth daily at 12 noon. 07/29/21   Maryjane Hurter, MD  estradiol (VIVELLE-DOT) 0.05 MG/24HR patch 1 patch 2 (two) times a week. 03/25/21   [provider]  fexofenadine (ALLEGRA) 180 MG tablet Take 180 mg by mouth daily.    [provider]  folic acid (FOLVITE) 1 MG tablet Take 2 mg by mouth daily. 05/10/19   [provider]  ibuprofen (ADVIL,MOTRIN) 800 MG tablet Take 1 tablet (800 mg total) by mouth every 8 (eight) hours as needed. 12/03/15   Ena Dawley, MD  methotrexate 2.5 MG tablet Take 15 mg by mouth once a week. 05/12/19   [provider]  methylPREDNISolone (MEDROL DOSEPAK) 4 MG TBPK tablet 6 day dose pack - take as directed 07/04/21   Edrick Kins,  DPM  SIMPONI ARIA 50 MG/4ML SOLN injection Inject into the vein. 08/03/20   [provider]  tolterodine (DETROL) 2 MG tablet Take 2 mg by mouth 2 (two) times daily.    [provider]  valACYclovir (VALTREX) 500 MG tablet SMARTSIG:1 Caplet By Mouth Daily 05/03/19   [provider]    Family History Family History  Problem Relation Age of Onset   Cancer Paternal Grandmother        stomach    Diabetes Father    Breast cancer Mother    Hypertension Mother    Scoliosis Daughter     Social History Social History   Tobacco Use   Smoking status: Never   Smokeless tobacco: Never  Vaping Use   Vaping Use: Never used  Substance Use Topics   Alcohol use: Yes    Alcohol/week: 2.0 standard drinks of alcohol    Types: 2 Glasses of wine per week   Drug use: No     Allergies   Doxycycline hyclate, Percocet [oxycodone-acetaminophen], and Tylenol [acetaminophen]   Review of Systems Review of Systems   Constitutional:  Negative for chills and fever.  HENT:  Positive for postnasal drip and sore throat. Negative for ear pain.   Respiratory:  Positive for cough. Negative for shortness of breath.   Cardiovascular:  Negative for chest pain and palpitations.  Gastrointestinal:  Negative for diarrhea and vomiting.  Skin:  Negative for color change and rash.  All other systems reviewed and are negative.    Physical Exam Triage Vital Signs ED Triage Vitals  Enc Vitals Group     BP      Pulse      Resp      Temp      Temp src      SpO2      Weight      Height      Head Circumference      Peak Flow      Pain Score      Pain Loc      Pain Edu?      Excl. in [place]?    No data found.  Updated Vital Signs BP 123/88   Pulse 95   Temp 98.5 F (36.9 C)   Resp 18   LMP 06/24/2012   SpO2 96%   Visual Acuity Right Eye Distance:   Left Eye Distance:   Bilateral Distance:    Right Eye Near:   Left Eye Near:    Bilateral Near:     Physical Exam Vitals and nursing note reviewed.  Constitutional:      General: She is not in acute distress.    Appearance: Normal appearance. She is well-developed. She is not ill-appearing.  HENT:     Right Ear: Tympanic membrane normal.     Left Ear: Tympanic membrane normal.     Nose: Nose normal.     Mouth/Throat:     Mouth: Mucous membranes are moist.     Pharynx: Oropharynx is clear.     Comments: Clear PND. Cardiovascular:     Rate and Rhythm: Normal rate and regular rhythm.     Heart sounds: Normal heart sounds.  Pulmonary:     Effort: Pulmonary effort is normal. No respiratory distress.     Breath sounds: Normal breath sounds.  Musculoskeletal:     Cervical back: Neck supple.  Skin:    General: Skin is warm and dry.  Neurological:     Mental Status:  She is alert.  Psychiatric:        Mood and Affect: Mood normal.        Behavior: Behavior normal.      UC Treatments / Results  Labs (all labs ordered are listed, but only  abnormal results are displayed) Labs Reviewed  SARS CORONAVIRUS 2 (TAT 6-24 HRS)  POCT RAPID STREP A (OFFICE)    EKG   Radiology No results found.  Procedures Procedures (including critical care time)  Medications Ordered in UC Medications - No data to display  Initial Impression / Assessment and Plan / UC Course  I have reviewed the triage vital signs and the nursing notes.  Pertinent labs & imaging results that were available during my care of the patient were reviewed by me and considered in my medical decision making (see chart for details).   Viral pharyngitis, postnasal drip.  Rapid strep negative.  COVID pending.  If COVID positive, recommend treatment with molnupiravir (no recent labs in chart).  Discussed symptomatic treatment including Tylenol, rest, hydration.  Instructed patient to follow up with her PCP if symptoms are not improving.  She agrees to plan of care.    Final Clinical Impressions(s) / UC Diagnoses   Final diagnoses:  Viral pharyngitis  Postnasal drip     Discharge Instructions      Your strep test is negative.  Your COVID test is pending.    Take Tylenol as needed for fever or discomfort.  Rest and keep yourself hydrated.    Follow-up with your primary care provider if your symptoms are not improving.         ED Prescriptions   None    PDMP not reviewed this encounter.   Sharion Balloon, NP 07/06/22 1224

## 2022-07-06 NOTE — ED Triage Notes (Signed)
Patient to Urgent Care with complaints of sore throat. Describes some sinus drainage. Husband home with sick covid/ flu symptoms. Denies any known fevers.   Reports symptoms started yesterday.

## 2022-07-06 NOTE — Discharge Instructions (Addendum)
Your strep test is negative.  Your COVID test is pending.    Take Tylenol as needed for fever or discomfort.  Rest and keep yourself hydrated.    Follow-up with your primary care provider if your symptoms are not improving.

## 2022-07-11 DIAGNOSIS — G4733 Obstructive sleep apnea (adult) (pediatric): Secondary | ICD-10-CM | POA: Diagnosis not present

## 2022-08-07 DIAGNOSIS — M0609 Rheumatoid arthritis without rheumatoid factor, multiple sites: Secondary | ICD-10-CM | POA: Diagnosis not present

## 2022-08-29 ENCOUNTER — Ambulatory Visit
Admission: EM | Admit: 2022-08-29 | Discharge: 2022-08-29 | Disposition: A | Discharge status: Home / Self Care | Payer: BC Managed Care – PPO | Attending: Urgent Care | Admitting: Urgent Care

## 2022-08-29 DIAGNOSIS — J069 Acute upper respiratory infection, unspecified: Secondary | ICD-10-CM

## 2022-08-29 MED ORDER — BENZONATATE 100 MG PO CAPS: ORAL_CAPSULE | ORAL | 0 refills | Status: DC

## 2022-08-29 MED ORDER — PSEUDOEPH-BROMPHEN-DM 30-2-10 MG/5ML PO SYRP: 5.0000 mL | ORAL_SOLUTION | Freq: Four times a day (QID) | ORAL | 0 refills | Status: DC | PRN

## 2022-08-29 NOTE — ED Triage Notes (Signed)
Pt presents with feeling bad since Sunday morning. Reports nasal congestion that has turned into chest congestion and a "wet cough". Reports coughing all night last night. She has tried mucinex at home.

## 2022-08-29 NOTE — ED Provider Notes (Signed)
Renal[facility] Fiddler    CSN: [id number] Arrival date & time: 08/29/22  2831      History   Chief Complaint Chief Complaint  Patient presents with   Cough    HPI Loretta Sweeney is a 52 y.o. female.    Cough   Patient presents to urgent care with complaint of "feeling bad" x 2 days.  She reports initial nasal congestion that has worsened.  Now reporting chest congestion and "wet cough".  Coughing "all night".  Using "Mucinex" at home for symptom control.  No fever. No sore throat.  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/11/2012   ASCUS on Pap smear 12/26/2011   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 Authorizing Provider  esomeprazole (NEXIUM) 40 MG capsule Take 1 capsule (40 mg total) by mouth daily at 12 noon. 07/29/21   Omar Person, MD   estradiol (VIVELLE-DOT) 0.05 MG/24HR patch 1 patch 2 (two) times a week. 03/25/21   [provider]  fexofenadine (ALLEGRA) 180 MG tablet Take 180 mg by mouth daily.    [provider]  folic acid (FOLVITE) 1 MG tablet Take 2 mg by mouth daily. 05/10/19   [provider]  ibuprofen (ADVIL,MOTRIN) 800 MG tablet Take 1 tablet (800 mg total) by mouth every 8 (eight) hours as needed. 12/03/15   Kirkland Hun, MD  methotrexate 2.5 MG tablet Take 15 mg by mouth once a week. 05/12/19   [provider]  methylPREDNISolone (MEDROL DOSEPAK) 4 MG TBPK tablet 6 day dose pack - take as directed 07/04/21   Felecia Shelling, DPM  predniSONE (DELTASONE) 5 MG tablet TAKE TWO TABLETS BY MOUTH DAILY FOR 7 DAYS. THEN TAKE ONE TABLET BY MOUTH DAILY FOR 7 DAYS. TAKE WITH FOOD OR MILK 06/28/22   [provider]  SIMPONI ARIA 50 MG/4ML SOLN injection Inject into the vein. 08/03/20   [provider]  tolterodine (DETROL) 2 MG tablet Take 2 mg by mouth 2 (two) times daily.    [provider]  valACYclovir (VALTREX) 500 MG tablet SMARTSIG:1 Caplet By Mouth Daily 05/03/19   [provider]    Family History Family History  Problem Relation Age of Onset   Cancer Paternal Grandmother        stomach    Diabetes Father    Breast cancer Mother    Hypertension Mother    Scoliosis Daughter     Social History Social History   Tobacco Use   Smoking status: Never   Smokeless tobacco: Never  Vaping Use   Vaping Use: Never used  Substance Use Topics   Alcohol use: Yes    Alcohol/week: 2.0 standard drinks of alcohol    Types: 2 Glasses of wine per week   Drug use: No     Allergies   Doxycycline hyclate, Percocet [oxycodone-acetaminophen], and Tylenol [acetaminophen]   Review of Systems Review of Systems  Respiratory:  Positive for cough.      Physical Exam Triage Vital Signs ED Triage Vitals  Enc Vitals Group     BP 08/29/22 0912 132/88      Pulse Rate 08/29/22 0912 85     Resp 08/29/22 0912 16     Temp 08/29/22 0912 98 F (36.7 C)     Temp Source 08/29/22 0912 Oral     SpO2 08/29/22 0912 98 %     Weight --      Height --      Head Circumference --      Peak Flow --      Pain Score 08/29/22 0913 4     Pain Loc --      Pain Edu? --      Excl. in [place]? --    No data found.  Updated Vital Signs BP 132/88 (BP Location: Left Arm)   Pulse 85   Temp 98 F (36.7 C) (Oral)   Resp 16   LMP 06/24/2012   SpO2 98%   Visual Acuity Right Eye Distance:   Left Eye Distance:   Bilateral Distance:    Right Eye Near:   Left Eye Near:    Bilateral Near:     Physical Exam Vitals reviewed.  Constitutional:      Appearance: Normal appearance.  HENT:     Mouth/Throat:     Mouth: Mucous membranes are moist.     Pharynx: Posterior oropharyngeal erythema present. No oropharyngeal exudate.  Cardiovascular:     Rate and Rhythm: Normal rate and regular rhythm.     Pulses: Normal pulses.     Heart sounds: Normal heart sounds.  Pulmonary:     Effort: Pulmonary effort is normal.     Breath sounds: Normal breath sounds.  Skin:    General: Skin is warm and dry.  Neurological:     General: No focal deficit present.     Mental Status: She is alert and oriented to person, place, and time.  Psychiatric:        Mood and Affect: Mood normal.        Behavior: Behavior normal.      UC Treatments / Results  Labs (all labs ordered are listed, but only abnormal results are displayed) Labs Reviewed - No data to display  EKG   Radiology No results found.  Procedures Procedures (including critical care time)  Medications Ordered in UC Medications - No data to display  Initial Impression /  Assessment and Plan / UC Course  I have reviewed the triage vital signs and the nursing notes.  Pertinent labs & imaging results that were available during my care of the patient were reviewed by me and considered in my medical  decision making (see chart for details).   Patient is afebrile here without recent antipyretics. Satting well on room air. Overall is well appearing, well hydrated, without respiratory distress. Pulmonary exam is unremarkable.  Lungs CTAB without wheezing, rhonchi, rales.  Mild pharyngeal erythema.  No peritonsillar exudates.  Patient's symptoms are consistent with acute viral process.  Supportive care and use of OTC medication for symptom control is recommended.  Counseled patient on potential for adverse effects with medications prescribed/recommended today, ER and return-to-clinic precautions discussed, patient verbalized understanding and agreement with care plan.   Final Clinical Impressions(s) / UC Diagnoses   Final diagnoses:  None   Discharge Instructions   None    ED Prescriptions   None    PDMP not reviewed this encounter.   Charma Igo, [place] 08/29/22 [phone]

## 2022-08-29 NOTE — Discharge Instructions (Addendum)
Follow up here or with your primary care provider if your symptoms are worsening or not improving.     

## 2022-09-02 DIAGNOSIS — M25561 Pain in right knee: Secondary | ICD-10-CM | POA: Diagnosis not present

## 2022-09-11 DIAGNOSIS — B078 Other viral warts: Secondary | ICD-10-CM | POA: Diagnosis not present

## 2022-09-12 DIAGNOSIS — M25561 Pain in right knee: Secondary | ICD-10-CM | POA: Diagnosis not present

## 2022-10-02 DIAGNOSIS — M0609 Rheumatoid arthritis without rheumatoid factor, multiple sites: Secondary | ICD-10-CM | POA: Diagnosis not present

## 2022-10-09 DIAGNOSIS — Z0000 Encounter for general adult medical examination without abnormal findings: Secondary | ICD-10-CM | POA: Diagnosis not present

## 2022-10-11 DIAGNOSIS — M67461 Ganglion, right knee: Secondary | ICD-10-CM | POA: Diagnosis not present

## 2022-10-11 DIAGNOSIS — B078 Other viral warts: Secondary | ICD-10-CM | POA: Diagnosis not present

## 2022-10-23 DIAGNOSIS — M0609 Rheumatoid arthritis without rheumatoid factor, multiple sites: Secondary | ICD-10-CM | POA: Diagnosis not present

## 2022-10-23 DIAGNOSIS — Z0000 Encounter for general adult medical examination without abnormal findings: Secondary | ICD-10-CM | POA: Diagnosis not present

## 2022-10-23 DIAGNOSIS — M1990 Unspecified osteoarthritis, unspecified site: Secondary | ICD-10-CM | POA: Diagnosis not present

## 2022-10-23 DIAGNOSIS — B078 Other viral warts: Secondary | ICD-10-CM | POA: Diagnosis not present

## 2022-10-30 DIAGNOSIS — M1990 Unspecified osteoarthritis, unspecified site: Secondary | ICD-10-CM | POA: Diagnosis not present

## 2022-10-30 DIAGNOSIS — R768 Other specified abnormal immunological findings in serum: Secondary | ICD-10-CM | POA: Diagnosis not present

## 2022-10-30 DIAGNOSIS — M0609 Rheumatoid arthritis without rheumatoid factor, multiple sites: Secondary | ICD-10-CM | POA: Diagnosis not present

## 2022-10-30 DIAGNOSIS — Z79899 Other long term (current) drug therapy: Secondary | ICD-10-CM | POA: Diagnosis not present

## 2022-11-10 DIAGNOSIS — G4733 Obstructive sleep apnea (adult) (pediatric): Secondary | ICD-10-CM | POA: Diagnosis not present

## 2022-11-14 DIAGNOSIS — B078 Other viral warts: Secondary | ICD-10-CM | POA: Diagnosis not present

## 2022-11-19 DIAGNOSIS — G4733 Obstructive sleep apnea (adult) (pediatric): Secondary | ICD-10-CM | POA: Diagnosis not present

## 2022-11-27 DIAGNOSIS — M0609 Rheumatoid arthritis without rheumatoid factor, multiple sites: Secondary | ICD-10-CM | POA: Diagnosis not present

## 2022-12-05 DIAGNOSIS — B078 Other viral warts: Secondary | ICD-10-CM | POA: Diagnosis not present

## 2023-01-05 DIAGNOSIS — R768 Other specified abnormal immunological findings in serum: Secondary | ICD-10-CM | POA: Diagnosis not present

## 2023-01-05 DIAGNOSIS — M0609 Rheumatoid arthritis without rheumatoid factor, multiple sites: Secondary | ICD-10-CM | POA: Diagnosis not present

## 2023-01-05 DIAGNOSIS — M1990 Unspecified osteoarthritis, unspecified site: Secondary | ICD-10-CM | POA: Diagnosis not present

## 2023-01-05 DIAGNOSIS — Z79899 Other long term (current) drug therapy: Secondary | ICD-10-CM | POA: Diagnosis not present

## 2023-01-23 DIAGNOSIS — M0609 Rheumatoid arthritis without rheumatoid factor, multiple sites: Secondary | ICD-10-CM | POA: Diagnosis not present

## 2023-02-19 DIAGNOSIS — L299 Pruritus, unspecified: Secondary | ICD-10-CM | POA: Diagnosis not present

## 2023-02-19 DIAGNOSIS — J300 Vasomotor rhinitis: Secondary | ICD-10-CM | POA: Diagnosis not present

## 2023-02-27 DIAGNOSIS — Z1231 Encounter for screening mammogram for malignant neoplasm of breast: Secondary | ICD-10-CM | POA: Diagnosis not present

## 2023-03-15 DIAGNOSIS — R768 Other specified abnormal immunological findings in serum: Secondary | ICD-10-CM | POA: Diagnosis not present

## 2023-03-15 DIAGNOSIS — Z79899 Other long term (current) drug therapy: Secondary | ICD-10-CM | POA: Diagnosis not present

## 2023-03-15 DIAGNOSIS — M0609 Rheumatoid arthritis without rheumatoid factor, multiple sites: Secondary | ICD-10-CM | POA: Diagnosis not present

## 2023-03-15 DIAGNOSIS — M1990 Unspecified osteoarthritis, unspecified site: Secondary | ICD-10-CM | POA: Diagnosis not present

## 2023-03-26 DIAGNOSIS — M0609 Rheumatoid arthritis without rheumatoid factor, multiple sites: Secondary | ICD-10-CM | POA: Diagnosis not present

## 2023-04-04 DIAGNOSIS — M26641 Arthritis of right temporomandibular joint: Secondary | ICD-10-CM | POA: Diagnosis not present

## 2023-05-02 DIAGNOSIS — L299 Pruritus, unspecified: Secondary | ICD-10-CM | POA: Diagnosis not present

## 2023-05-02 DIAGNOSIS — M1990 Unspecified osteoarthritis, unspecified site: Secondary | ICD-10-CM | POA: Diagnosis not present

## 2023-05-02 DIAGNOSIS — R5383 Other fatigue: Secondary | ICD-10-CM | POA: Diagnosis not present

## 2023-05-22 ENCOUNTER — Ambulatory Visit
Admission: EM | Admit: 2023-05-22 | Discharge: 2023-05-22 | Disposition: A | Discharge status: Home / Self Care | Payer: BC Managed Care – PPO | Attending: Emergency Medicine | Admitting: Emergency Medicine

## 2023-05-22 DIAGNOSIS — H109 Unspecified conjunctivitis: Secondary | ICD-10-CM

## 2023-05-22 DIAGNOSIS — B9689 Other specified bacterial agents as the cause of diseases classified elsewhere: Secondary | ICD-10-CM | POA: Diagnosis not present

## 2023-05-22 MED ORDER — ERYTHROMYCIN 5 MG/GM OP OINT: TOPICAL_OINTMENT | OPHTHALMIC | 0 refills | Status: DC

## 2023-05-22 NOTE — ED Provider Notes (Signed)
 [facility]    CSN: [id number] Arrival date & time: 05/22/23  1419      History   Chief Complaint Chief Complaint  Patient presents with   Eye Problem    HPI Loretta Sweeney is a 52 y.o. female.   Patient presents for evaluation of left eye drainage with crusting, itching and a burning sensation present beginning 1 day ago.  Has now spread to the right eye as the day has progressed.  Known sick contact with pinkeye.  Denies use of contacts but does use glasses.  Has not attempted treatment.  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   ASCUS on Pap smear 12/26/2011   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 Authorizing Provider  erythromycin  ophthalmic ointment Place a 1/2 inch ribbon of ointment into the lower eyelid every 6  hours for 7 days 05/22/23  Yes Teresa Shelba SAUNDERS, NP  benzonatate  (TESSALON ) 100 MG capsule Take 1-2 tablets 3 times a day as needed for cough Patient not taking: Reported on 05/22/2023 08/29/22   Immordino, Garnette, FNP  brompheniramine-pseudoephedrine-DM 30-2-10 MG/5ML syrup Take 5 mLs by mouth 4 (four) times daily as needed. Patient not taking: Reported on 05/22/2023 08/29/22   Immordino, Garnette, FNP  esomeprazole  (NEXIUM ) 40 MG capsule Take 1 capsule (40 mg total) by mouth daily at 12 noon. 07/29/21   Gladis Leonor HERO, MD  estradiol (VIVELLE-DOT) 0.05 MG/24HR patch 1 patch 2 (two) times a week. 03/25/21   [provider]  fexofenadine (ALLEGRA) 180 MG tablet Take 180 mg by mouth daily.    [provider]  folic acid (FOLVITE) 1 MG tablet Take 2 mg by mouth daily. 05/10/19   [provider]  ibuprofen  (ADVIL ,MOTRIN ) 800 MG tablet Take 1 tablet (800 mg total) by mouth every 8 (eight) hours as needed. 12/03/15   Rigg Rome, MD  methotrexate 2.5 MG tablet Take 15 mg  by mouth once a week. 05/12/19   [provider]  methylPREDNISolone  (MEDROL  DOSEPAK) 4 MG TBPK tablet 6 day dose pack - take as directed Patient not taking: Reported on 05/22/2023 07/04/21   Janit Thresa HERO, DPM  predniSONE (DELTASONE) 5 MG tablet TAKE TWO TABLETS BY MOUTH DAILY FOR 7 DAYS. THEN TAKE ONE TABLET BY MOUTH DAILY FOR 7 DAYS. TAKE WITH FOOD OR MILK Patient not taking: Reported on 05/22/2023 06/28/22   [provider]  SIMPONI  ARIA 50 MG/4ML SOLN injection Inject into the vein. Patient not taking: Reported on 05/22/2023 08/03/20   [provider]  tolterodine (DETROL) 2 MG tablet Take 2 mg by mouth 2 (two) times daily.    [provider]  valACYclovir  (VALTREX ) 500 MG tablet SMARTSIG:1 Caplet By Mouth Daily 05/03/19   [provider]    Family History Family History  Problem Relation Age of Onset   Cancer Paternal Grandmother        stomach     Diabetes Father    Breast cancer Mother    Hypertension Mother    Scoliosis Daughter     Social History Social History   Tobacco Use   Smoking status: Never   Smokeless tobacco: Never  Vaping Use   Vaping status: Never Used  Substance Use Topics   Alcohol use: Yes    Alcohol/week: 2.0 standard drinks of alcohol    Types: 2 Glasses of wine per week   Drug use: No     Allergies   Doxycycline  hyclate, Percocet [oxycodone -acetaminophen ], and Tylenol  [acetaminophen ]   Review of Systems Review of Systems   Physical Exam Triage Vital Signs ED Triage Vitals  Encounter Vitals Group     BP 05/22/23 1525 120/80     Systolic BP Percentile --      Diastolic BP Percentile --      Pulse Rate 05/22/23 1525 74     Resp 05/22/23 1525 18     Temp 05/22/23 1525 98 F (36.7 C)     Temp src --      SpO2 05/22/23 1525 98 %     Weight --      Height --      Head Circumference --      Peak Flow --      Pain Score 05/22/23 1512 0     Pain Loc --      Pain Education --      Exclude from Growth Chart --    No data found.  Updated Vital Signs BP 120/80   Pulse 74   Temp 98 F (36.7 C)   Resp 18   LMP 06/24/2012   SpO2 98%   Visual Acuity Right Eye Distance: 20/30 Left Eye Distance: 20/30 Bilateral Distance: 20/25  Right Eye Near:   Left Eye Near:    Bilateral Near:     Physical Exam Constitutional:      Appearance: Normal appearance.  Eyes:     Comments: mild erythema present to the bilateral conjunctiva, no drainage noted on exam, vision is grossly intact, extraocular movements intact  Neurological:     Mental Status: She is alert and oriented to person, place, and time. Mental status is at baseline.      UC Treatments / Results  Labs (all labs ordered are listed, but only abnormal results are displayed) Labs Reviewed - No data to display  EKG   Radiology No results found.  Procedures Procedures (including critical care time)  Medications  Ordered  in UC Medications - No data to display  Initial Impression / Assessment and Plan / UC Course  I have reviewed the triage vital signs and the nursing notes.  Pertinent labs & imaging results that were available during my care of the patient were reviewed by me and considered in my medical decision making (see chart for details).  Bacterial conjunctivitis of both eyes  Presentation and symptomology consistent with above diagnosis, discussed with patient, prescribed erythromycin  ointment and discussed administration, recommended additional supportive care with follow-up if symptoms continue to persist worsen or recur Final Clinical Impressions(s) / UC Diagnoses   Final diagnoses:  Bacterial conjunctivitis of both eyes     Discharge Instructions      Today you being treated for bacterial conjunctivitis.   Place erythromycin  ointment into the lower water line every 6 hours while awake for 7 days if the other eye starts to have symptoms you may use medication in it as well. Do not allow tip of dropper to touch eye.  May use cool compress for comfort and to remove discharge if present. Pat the eye, do not wipe.  Do not rub eyes, this may cause more irritation.  May use, Claritin Zyrtec or benadryl  as needed to help if itching present.  Please avoid use of eye makeup until symptoms clear.  If symptoms persist after use of medication, please follow up at Urgent Care or with ophthalmologist (eye doctor)    ED Prescriptions     Medication Sig Dispense Auth. Provider   erythromycin  ophthalmic ointment Place a 1/2 inch ribbon of ointment into the lower eyelid every 6 hours for 7 days 3.5 g Teresa Shelba SAUNDERS, NP      PDMP not reviewed this encounter.   Teresa Shelba SAUNDERS, [place] 05/22/23 [phone]

## 2023-05-22 NOTE — Discharge Instructions (Addendum)
 Today you being treated for bacterial conjunctivitis.   Place erythromycin  ointment into the lower water line every 6 hours while awake for 7 days if the other eye starts to have symptoms you may use medication in it as well. Do not allow tip of dropper to touch eye.  May use cool compress for comfort and to remove discharge if present. Pat the eye, do not wipe.  Do not rub eyes, this may cause more irritation.  May use, Claritin Zyrtec or benadryl  as needed to help if itching present.  Please avoid use of eye makeup until symptoms clear.  If symptoms persist after use of medication, please follow up at Urgent Care or with ophthalmologist (eye doctor)

## 2023-05-22 NOTE — ED Triage Notes (Signed)
Patient to Urgent Care with complaints of left sided eye crusting/ itching.  Symptoms started last night. Woke up this morning and eye was crusted shut.  Granddaughter recently w/ pink eye.

## 2023-05-31 DIAGNOSIS — L299 Pruritus, unspecified: Secondary | ICD-10-CM | POA: Diagnosis not present

## 2023-06-05 DIAGNOSIS — R051 Acute cough: Secondary | ICD-10-CM | POA: Diagnosis not present

## 2023-06-05 DIAGNOSIS — J0100 Acute maxillary sinusitis, unspecified: Secondary | ICD-10-CM | POA: Diagnosis not present

## 2023-06-08 DIAGNOSIS — G4733 Obstructive sleep apnea (adult) (pediatric): Secondary | ICD-10-CM | POA: Diagnosis not present

## 2024-06-16 ENCOUNTER — Ambulatory Visit
Admission: EM | Admit: 2024-06-16 | Discharge: 2024-06-16 | Disposition: A | Discharge status: Home / Self Care | Attending: Emergency Medicine | Admitting: Emergency Medicine

## 2024-06-16 ENCOUNTER — Encounter: Payer: Self-pay | Admitting: Emergency Medicine

## 2024-06-16 DIAGNOSIS — J0140 Acute pansinusitis, unspecified: Secondary | ICD-10-CM | POA: Diagnosis not present

## 2024-06-16 LAB — POC COVID19/FLU A&B COMBO
Covid Antigen, POC: NEGATIVE
Influenza A Antigen, POC: NEGATIVE
Influenza B Antigen, POC: NEGATIVE

## 2024-06-16 MED ORDER — FLUTICASONE PROPIONATE 50 MCG/ACT NA SUSP: 2.0000 | Freq: Every day | NASAL | 0 refills | Status: AC

## 2024-06-16 MED ORDER — LEVOFLOXACIN 750 MG PO TABS: 750.0000 mg | ORAL_TABLET | Freq: Every day | ORAL | 0 refills | Status: AC

## 2024-06-16 MED ORDER — IPRATROPIUM BROMIDE 0.06 % NA SOLN: 2.0000 | Freq: Four times a day (QID) | NASAL | 0 refills | Status: AC

## 2024-06-16 MED ORDER — PROMETHAZINE-DM 6.25-15 MG/5ML PO SYRP: 5.0000 mL | ORAL_SOLUTION | Freq: Four times a day (QID) | ORAL | 0 refills | Status: AC | PRN

## 2024-06-16 NOTE — ED Provider Notes (Signed)
 " HPI  SUBJECTIVE:  Loretta Sweeney is a 54 y.o. female who presents with 3 days of nasal congestion, postnasal drip, severe sinus pain or pressure, headaches, bloody mucoid rhinorrhea, upper dental pain, bilateral ear pain/pressure.  She reports a cough productive of the same material as her nasal congestion.  No facial swelling, wheezing, shortness of breath.  No antibiotics in the past month.  Antipyretic in the past 6 hours.  She is unable to sleep at night because of the cough.  She tried ibuprofen  400 mg without improvement in her symptoms.  Symptoms are worse with bending/lying down with head movement.  She has a past medical history of rheumatoid arthritis on methotrexate, OSA on CPAP.  She gets hives with Tylenol , penicillin, amoxicillin and doxycycline .  PCP: [facility]    Past Medical History:  Diagnosis Date   Abnormal Pap smear 11/2011   Bacterial infection    Hx   H/O varicella    HSV-2 (herpes simplex virus 2) infection    Low iron    Hx   Lower abdominal pain 12/25/2011   SVD (spontaneous vaginal delivery)    x 1   Yeast infection    Hx   Yeast infection     Past Surgical History:  Procedure Laterality Date   ABDOMINAL HYSTERECTOMY     VAGINAL HYSTERECTOMY N/A 12/02/2015   Procedure: HYSTERECTOMY VAGINAL (B) Salpingectomy;  Surgeon: Rome Rigg, MD;  Location: [facility];  Service: Gynecology;  Laterality: N/A;   WISDOM TOOTH EXTRACTION      Family History  Problem Relation Age of Onset   Cancer Paternal Grandmother        stomach    Diabetes Father    Breast cancer Mother    Hypertension Mother    Scoliosis Daughter     Social History[1]  Current Medications[2]  Allergies[3]   ROS  As noted in HPI.   Physical Exam  BP (!) 155/91 (BP Location: Right Arm)   Pulse 81   Temp 98.1 F (36.7 C) (Oral)   Resp 16   Wt 71.2 kg   LMP 06/24/2012   SpO2 97%   BMI 26.95 kg/m   Constitutional: Well developed, well nourished, no acute  distress Eyes: PERRL, EOMI, conjunctiva normal bilaterally HENT: Normocephalic, atraumatic,mucus membranes moist.  Purulent nasal congestion.  Erythematous, but not swollen nasal mucosa.  Exquisite frontal, maxillary sinus tenderness.  No postnasal drip.  TMs normal bilaterally Respiratory: Clear to auscultation bilaterally, no rales, no wheezing, no rhonchi Cardiovascular: Normal rate and rhythm, no murmurs, no gallops, no rubs GI: nondistended skin: No rash, skin intact Musculoskeletal: no deformities Neurologic: Alert & oriented x 3, CN III-XII grossly intact, no motor deficits, sensation grossly intact Psychiatric: Speech and behavior appropriate   ED Course   Medications - No data to display  Orders Placed This Encounter  Procedures   POC Covid19/Flu A&B Antigen    Standing Status:   Standing    Number of Occurrences:   1   Results for orders placed or performed during the hospital encounter of 06/16/24 (from the past 24 hours)  POC Covid19/Flu A&B Antigen     Status: Normal   Collection Time: 06/16/24  3:38 PM  Result Value Ref Range   Influenza A Antigen, POC Negative Negative   Influenza B Antigen, POC Negative Negative   Covid Antigen, POC Negative Negative   No results found.  ED Clinical Impression  1. Acute non-recurrent pansinusitis  ED Assessment/Plan    COVID, flu negative.  Patient presents with acute pansinusitis.  She qualifies for antibiotics due to severe symptoms of facial swelling, upper dental pain and because she is immunocompromised.  She reports diffuse rash with penicillins, amoxicillin and doxycycline .  Thus we will send home with Levaquin  750 mg daily for 7 days.  Mucinex D, saline nasal irrigation, prednisone 40 mg for 5 days, Atrovent  nasal spray or may use Flonase  that she has at home.  Ibuprofen  600 mg 3-4 times a day as needed for pain if it is safe to take with methotrexate.  Follow-up PCP as needed.  Discussed labs,  MDM, treatment  plan, and plan for follow-up with patient  patient agrees with plan.   Meds ordered this encounter  Medications   fluticasone  (FLONASE ) 50 MCG/ACT nasal spray    Sig: Place 2 sprays into both nostrils daily.    Dispense:  16 g    Refill:  0   ipratropium (ATROVENT ) 0.06 % nasal spray    Sig: Place 2 sprays into both nostrils 4 (four) times daily.    Dispense:  15 mL    Refill:  0   promethazine -dextromethorphan (PROMETHAZINE -DM) 6.25-15 MG/5ML syrup    Sig: Take 5 mLs by mouth 4 (four) times daily as needed for cough.    Dispense:  118 mL    Refill:  0   levofloxacin  (LEVAQUIN ) 750 MG tablet    Sig: Take 1 tablet (750 mg total) by mouth daily for 7 days.    Dispense:  7 tablet    Refill:  0      *This clinic note was created using Scientist, clinical (histocompatibility and immunogenetics). Therefore, there may be occasional mistakes despite careful proofreading. ?      [1]  Social History Tobacco Use   Smoking status: Never   Smokeless tobacco: Never  Vaping Use   Vaping status: Never Used  Substance Use Topics   Alcohol use: Yes    Alcohol/week: 2.0 standard drinks of alcohol    Types: 2 Glasses of wine per week   Drug use: No  [2] No current facility-administered medications for this encounter.  Current Outpatient Medications:    fluticasone  (FLONASE ) 50 MCG/ACT nasal spray, Place 2 sprays into both nostrils daily., Disp: 16 g, Rfl: 0   ipratropium (ATROVENT ) 0.06 % nasal spray, Place 2 sprays into both nostrils 4 (four) times daily., Disp: 15 mL, Rfl: 0   levofloxacin  (LEVAQUIN ) 750 MG tablet, Take 1 tablet (750 mg total) by mouth daily for 7 days., Disp: 7 tablet, Rfl: 0   promethazine -dextromethorphan (PROMETHAZINE -DM) 6.25-15 MG/5ML syrup, Take 5 mLs by mouth 4 (four) times daily as needed for cough., Disp: 118 mL, Rfl: 0   esomeprazole  (NEXIUM ) 40 MG capsule, Take 1 capsule (40 mg total) by mouth daily at 12 noon., Disp: 90 capsule, Rfl: 0   estradiol (VIVELLE-DOT) 0.05 MG/24HR patch, 1 patch  2 (two) times a week., Disp: , Rfl:    [Paused] fexofenadine (ALLEGRA) 180 MG tablet, Take 180 mg by mouth daily., Disp: , Rfl:    folic acid (FOLVITE) 1 MG tablet, Take 2 mg by mouth daily., Disp: , Rfl:    methotrexate 2.5 MG tablet, Take 15 mg by mouth once a week., Disp: , Rfl:    tolterodine (DETROL) 2 MG tablet, Take 2 mg by mouth 2 (two) times daily., Disp: , Rfl:    valACYclovir  (VALTREX ) 500 MG tablet, SMARTSIG:1 Caplet By Mouth Daily, Disp: , Rfl:  [  3]  Allergies Allergen Reactions   Amoxicillin Other (See Comments)   Doxycycline  Hyclate     Other reaction(s): Yeast   Penicillin G Other (See Comments)   Percocet [Oxycodone -Acetaminophen ]    Tylenol  [Acetaminophen ]      Semira Stoltzfus, MD 06/17/24 1013  "

## 2024-06-16 NOTE — Discharge Instructions (Signed)
 COVID, flu testing negative.    Start Mucinex-D to keep the mucous thin and to decongest you.   You may take 600 mg of motrin  3-4 times a day as needed for pain if this is safe to take with methotrexate.  Finish antibiotics and steroids, even if you feel better.  Atrovent  nasal spray or Flonase .  Use a [employer]Med sinus rinse with distilled water as often as you want to to reduce nasal congestion. Follow the directions on the box.  Promethazine  DM for cough.  Go to www.goodrx.com to look up your medications. This will give you a list of where you can find your prescriptions at the most affordable prices. Or you can ask the pharmacist what the cash price is. This is frequently cheaper than going through insurance.

## 2024-06-16 NOTE — ED Triage Notes (Signed)
 Pt c/o headache, sinus pressure, cough, ear pain and nasal congestion x 2 days. Pt has taken OTC sinus medication.

## 2024-06-17 ENCOUNTER — Ambulatory Visit: Payer: Self-pay
